# Patient Record
Sex: Female | Born: 1935 | Race: White | Hispanic: No | Smoking: Former smoker
Health system: Southern US, Community
[De-identification: ages and names within clinical notes are randomized; demographics above are authoritative.]

## PROBLEM LIST (undated history)

## (undated) DIAGNOSIS — I723 Aneurysm of iliac artery: Secondary | ICD-10-CM

## (undated) DIAGNOSIS — J449 Chronic obstructive pulmonary disease, unspecified: Secondary | ICD-10-CM

## (undated) DIAGNOSIS — I4729 Other ventricular tachycardia: Secondary | ICD-10-CM

## (undated) DIAGNOSIS — I472 Ventricular tachycardia: Secondary | ICD-10-CM

## (undated) DIAGNOSIS — E785 Hyperlipidemia, unspecified: Secondary | ICD-10-CM

## (undated) DIAGNOSIS — I714 Abdominal aortic aneurysm, without rupture, unspecified: Secondary | ICD-10-CM

## (undated) DIAGNOSIS — N762 Acute vulvitis: Secondary | ICD-10-CM

## (undated) DIAGNOSIS — I4891 Unspecified atrial fibrillation: Secondary | ICD-10-CM

## (undated) DIAGNOSIS — I255 Ischemic cardiomyopathy: Secondary | ICD-10-CM

## (undated) DIAGNOSIS — I701 Atherosclerosis of renal artery: Secondary | ICD-10-CM

## (undated) DIAGNOSIS — A4902 Methicillin resistant Staphylococcus aureus infection, unspecified site: Secondary | ICD-10-CM

## (undated) DIAGNOSIS — I6529 Occlusion and stenosis of unspecified carotid artery: Secondary | ICD-10-CM

## (undated) DIAGNOSIS — I2510 Atherosclerotic heart disease of native coronary artery without angina pectoris: Secondary | ICD-10-CM

## (undated) DIAGNOSIS — Z720 Tobacco use: Secondary | ICD-10-CM

## (undated) DIAGNOSIS — I10 Essential (primary) hypertension: Secondary | ICD-10-CM

## (undated) DIAGNOSIS — G459 Transient cerebral ischemic attack, unspecified: Secondary | ICD-10-CM

## (undated) DIAGNOSIS — I739 Peripheral vascular disease, unspecified: Secondary | ICD-10-CM

## (undated) HISTORY — DX: Peripheral vascular disease, unspecified: I73.9

## (undated) HISTORY — DX: Abdominal aortic aneurysm, without rupture, unspecified: I71.40

## (undated) HISTORY — DX: Other ventricular tachycardia: I47.29

## (undated) HISTORY — DX: Ventricular tachycardia: I47.2

## (undated) HISTORY — DX: Unspecified atrial fibrillation: I48.91

## (undated) HISTORY — DX: Hyperlipidemia, unspecified: E78.5

## (undated) HISTORY — DX: Tobacco use: Z72.0

## (undated) HISTORY — DX: Atherosclerosis of renal artery: I70.1

## (undated) HISTORY — DX: Essential (primary) hypertension: I10

## (undated) HISTORY — DX: Acute vulvitis: N76.2

## (undated) HISTORY — PX: FINGER SURGERY: SHX640

## (undated) HISTORY — DX: Ischemic cardiomyopathy: I25.5

## (undated) HISTORY — DX: Aneurysm of iliac artery: I72.3

## (undated) HISTORY — DX: Methicillin resistant Staphylococcus aureus infection, unspecified site: A49.02

## (undated) HISTORY — PX: CHOLECYSTECTOMY: SHX55

## (undated) HISTORY — DX: Atherosclerotic heart disease of native coronary artery without angina pectoris: I25.10

## (undated) HISTORY — DX: Occlusion and stenosis of unspecified carotid artery: I65.29

## (undated) HISTORY — DX: Abdominal aortic aneurysm, without rupture: I71.4

## (undated) HISTORY — DX: Chronic obstructive pulmonary disease, unspecified: J44.9

## (undated) HISTORY — DX: Transient cerebral ischemic attack, unspecified: G45.9

---

## 1986-08-31 HISTORY — PX: CAROTID ENDARTERECTOMY: SUR193

## 1994-08-31 HISTORY — PX: CORONARY ARTERY BYPASS GRAFT: SHX141

## 1998-04-17 ENCOUNTER — Emergency Department (HOSPITAL_COMMUNITY): Admission: EM | Admit: 1998-04-17 | Discharge: 1998-04-17 | Payer: Self-pay | Admitting: Family Medicine

## 1998-07-20 ENCOUNTER — Encounter: Payer: Self-pay | Admitting: Internal Medicine

## 1998-07-20 ENCOUNTER — Emergency Department (HOSPITAL_COMMUNITY): Admission: EM | Admit: 1998-07-20 | Discharge: 1998-07-20 | Payer: Self-pay | Admitting: Emergency Medicine

## 1998-07-20 ENCOUNTER — Ambulatory Visit (HOSPITAL_COMMUNITY): Admission: RE | Admit: 1998-07-20 | Discharge: 1998-07-20 | Payer: Self-pay | Admitting: Internal Medicine

## 1998-08-27 ENCOUNTER — Ambulatory Visit (HOSPITAL_COMMUNITY): Admission: RE | Admit: 1998-08-27 | Discharge: 1998-08-27 | Payer: Self-pay | Admitting: Family Medicine

## 1998-08-27 ENCOUNTER — Encounter: Payer: Self-pay | Admitting: Family Medicine

## 1998-12-03 ENCOUNTER — Emergency Department (HOSPITAL_COMMUNITY): Admission: EM | Admit: 1998-12-03 | Discharge: 1998-12-03 | Payer: Self-pay | Admitting: Emergency Medicine

## 1998-12-03 ENCOUNTER — Encounter: Payer: Self-pay | Admitting: Emergency Medicine

## 1999-05-21 ENCOUNTER — Ambulatory Visit (HOSPITAL_COMMUNITY): Admission: RE | Admit: 1999-05-21 | Discharge: 1999-05-21 | Payer: Self-pay | Admitting: *Deleted

## 1999-11-07 ENCOUNTER — Inpatient Hospital Stay (HOSPITAL_COMMUNITY): Admission: EM | Admit: 1999-11-07 | Discharge: 1999-11-10 | Payer: Self-pay | Admitting: Emergency Medicine

## 1999-11-07 ENCOUNTER — Encounter: Payer: Self-pay | Admitting: Emergency Medicine

## 1999-11-08 ENCOUNTER — Encounter: Payer: Self-pay | Admitting: Neurology

## 1999-11-25 ENCOUNTER — Ambulatory Visit (HOSPITAL_COMMUNITY): Admission: RE | Admit: 1999-11-25 | Discharge: 1999-11-25 | Payer: Self-pay | Admitting: *Deleted

## 2000-08-16 ENCOUNTER — Inpatient Hospital Stay (HOSPITAL_COMMUNITY): Admission: EM | Admit: 2000-08-16 | Discharge: 2000-08-19 | Payer: Self-pay | Admitting: Emergency Medicine

## 2000-08-16 ENCOUNTER — Encounter: Payer: Self-pay | Admitting: Emergency Medicine

## 2002-06-28 ENCOUNTER — Ambulatory Visit (HOSPITAL_COMMUNITY): Admission: RE | Admit: 2002-06-28 | Discharge: 2002-06-28 | Payer: Self-pay | Admitting: Family Medicine

## 2002-10-10 ENCOUNTER — Encounter: Payer: Self-pay | Admitting: Emergency Medicine

## 2002-10-10 ENCOUNTER — Emergency Department (HOSPITAL_COMMUNITY): Admission: EM | Admit: 2002-10-10 | Discharge: 2002-10-10 | Payer: Self-pay | Admitting: Emergency Medicine

## 2002-10-19 ENCOUNTER — Inpatient Hospital Stay (HOSPITAL_COMMUNITY): Admission: EM | Admit: 2002-10-19 | Discharge: 2002-10-24 | Payer: Self-pay | Admitting: Internal Medicine

## 2002-10-19 ENCOUNTER — Encounter: Payer: Self-pay | Admitting: Cardiology

## 2002-10-19 ENCOUNTER — Encounter: Payer: Self-pay | Admitting: Emergency Medicine

## 2002-10-19 ENCOUNTER — Emergency Department (HOSPITAL_COMMUNITY): Admission: EM | Admit: 2002-10-19 | Discharge: 2002-10-19 | Payer: Self-pay | Admitting: Emergency Medicine

## 2004-01-09 ENCOUNTER — Emergency Department (HOSPITAL_COMMUNITY): Admission: EM | Admit: 2004-01-09 | Discharge: 2004-01-09 | Payer: Self-pay | Admitting: Emergency Medicine

## 2004-02-01 ENCOUNTER — Inpatient Hospital Stay (HOSPITAL_COMMUNITY): Admission: AD | Admit: 2004-02-01 | Discharge: 2004-02-13 | Payer: Self-pay | Admitting: *Deleted

## 2004-02-01 ENCOUNTER — Emergency Department (HOSPITAL_COMMUNITY): Admission: EM | Admit: 2004-02-01 | Discharge: 2004-02-01 | Payer: Self-pay | Admitting: Emergency Medicine

## 2004-04-14 ENCOUNTER — Ambulatory Visit (HOSPITAL_COMMUNITY): Admission: RE | Admit: 2004-04-14 | Discharge: 2004-04-14 | Payer: Self-pay | Admitting: Internal Medicine

## 2004-05-01 ENCOUNTER — Emergency Department (HOSPITAL_COMMUNITY): Admission: EM | Admit: 2004-05-01 | Discharge: 2004-05-01 | Payer: Self-pay | Admitting: Emergency Medicine

## 2004-05-07 ENCOUNTER — Ambulatory Visit: Payer: Self-pay | Admitting: Infectious Diseases

## 2004-05-09 ENCOUNTER — Ambulatory Visit: Payer: Self-pay | Admitting: *Deleted

## 2004-05-29 ENCOUNTER — Ambulatory Visit: Payer: Self-pay | Admitting: *Deleted

## 2004-05-29 ENCOUNTER — Ambulatory Visit: Payer: Self-pay | Admitting: Internal Medicine

## 2004-05-30 ENCOUNTER — Ambulatory Visit: Payer: Self-pay | Admitting: *Deleted

## 2004-07-16 ENCOUNTER — Ambulatory Visit: Payer: Self-pay | Admitting: Internal Medicine

## 2004-07-30 ENCOUNTER — Ambulatory Visit: Payer: Self-pay | Admitting: Internal Medicine

## 2004-08-21 ENCOUNTER — Ambulatory Visit: Payer: Self-pay | Admitting: Internal Medicine

## 2004-09-01 ENCOUNTER — Ambulatory Visit: Payer: Self-pay | Admitting: Internal Medicine

## 2004-09-09 ENCOUNTER — Ambulatory Visit: Payer: Self-pay | Admitting: Internal Medicine

## 2005-04-28 ENCOUNTER — Ambulatory Visit: Payer: Self-pay | Admitting: Cardiology

## 2005-05-06 ENCOUNTER — Ambulatory Visit: Payer: Self-pay

## 2005-05-12 ENCOUNTER — Encounter: Admission: RE | Admit: 2005-05-12 | Discharge: 2005-05-12 | Payer: Self-pay | Admitting: Orthopedic Surgery

## 2005-05-13 ENCOUNTER — Ambulatory Visit (HOSPITAL_BASED_OUTPATIENT_CLINIC_OR_DEPARTMENT_OTHER): Admission: RE | Admit: 2005-05-13 | Discharge: 2005-05-13 | Payer: Self-pay | Admitting: Orthopedic Surgery

## 2005-05-13 ENCOUNTER — Ambulatory Visit (HOSPITAL_COMMUNITY): Admission: RE | Admit: 2005-05-13 | Discharge: 2005-05-13 | Payer: Self-pay | Admitting: Orthopedic Surgery

## 2005-10-30 ENCOUNTER — Ambulatory Visit: Payer: Self-pay

## 2005-11-03 ENCOUNTER — Ambulatory Visit: Payer: Self-pay | Admitting: Cardiology

## 2006-03-26 ENCOUNTER — Ambulatory Visit: Payer: Self-pay | Admitting: Cardiology

## 2006-03-26 ENCOUNTER — Inpatient Hospital Stay (HOSPITAL_COMMUNITY): Admission: EM | Admit: 2006-03-26 | Discharge: 2006-04-01 | Payer: Self-pay | Admitting: Emergency Medicine

## 2006-03-30 ENCOUNTER — Encounter (INDEPENDENT_AMBULATORY_CARE_PROVIDER_SITE_OTHER): Payer: Self-pay | Admitting: Specialist

## 2006-04-05 ENCOUNTER — Encounter: Admission: AD | Admit: 2006-04-05 | Discharge: 2006-04-05 | Payer: Self-pay | Admitting: Dentistry

## 2006-04-05 ENCOUNTER — Ambulatory Visit: Payer: Self-pay | Admitting: Dentistry

## 2006-04-08 ENCOUNTER — Ambulatory Visit (HOSPITAL_COMMUNITY): Admission: RE | Admit: 2006-04-08 | Discharge: 2006-04-08 | Payer: Self-pay | Admitting: Dentistry

## 2006-04-08 ENCOUNTER — Ambulatory Visit: Payer: Self-pay | Admitting: Dentistry

## 2006-05-07 ENCOUNTER — Ambulatory Visit: Payer: Self-pay

## 2006-05-10 ENCOUNTER — Ambulatory Visit: Payer: Self-pay | Admitting: Cardiology

## 2006-05-21 ENCOUNTER — Ambulatory Visit: Payer: Self-pay | Admitting: Dentistry

## 2006-07-01 ENCOUNTER — Ambulatory Visit: Payer: Self-pay | Admitting: Dentistry

## 2006-07-20 ENCOUNTER — Emergency Department (HOSPITAL_COMMUNITY): Admission: EM | Admit: 2006-07-20 | Discharge: 2006-07-20 | Payer: Self-pay | Admitting: Emergency Medicine

## 2006-09-03 ENCOUNTER — Ambulatory Visit: Payer: Self-pay | Admitting: Dentistry

## 2006-10-29 ENCOUNTER — Ambulatory Visit: Payer: Self-pay | Admitting: Cardiology

## 2006-10-29 LAB — CONVERTED CEMR LAB
BUN: 8 mg/dL (ref 6–23)
Basophils Relative: 1.5 % — ABNORMAL HIGH (ref 0.0–1.0)
CO2: 29 meq/L (ref 19–32)
Calcium: 9.8 mg/dL (ref 8.4–10.5)
Creatinine, Ser: 0.9 mg/dL (ref 0.4–1.2)
Hemoglobin: 14.8 g/dL (ref 12.0–15.0)
Monocytes Absolute: 0.8 10*3/uL — ABNORMAL HIGH (ref 0.2–0.7)
Monocytes Relative: 9.3 % (ref 3.0–11.0)
Potassium: 3.9 meq/L (ref 3.5–5.1)
RDW: 12.6 % (ref 11.5–14.6)
TSH: 0.66 microintl units/mL (ref 0.35–5.50)

## 2006-11-15 ENCOUNTER — Ambulatory Visit: Payer: Self-pay | Admitting: Internal Medicine

## 2006-11-15 LAB — CONVERTED CEMR LAB
BUN: 12 mg/dL (ref 6–23)
Calcium: 9.3 mg/dL (ref 8.4–10.5)
Chloride: 103 meq/L (ref 96–112)
Creatinine, Ser: 1.1 mg/dL (ref 0.4–1.2)
Pro B Natriuretic peptide (BNP): 580 pg/mL — ABNORMAL HIGH (ref 0.0–100.0)

## 2006-11-23 ENCOUNTER — Ambulatory Visit: Payer: Self-pay

## 2006-11-23 ENCOUNTER — Encounter: Payer: Self-pay | Admitting: Cardiology

## 2006-12-03 ENCOUNTER — Emergency Department (HOSPITAL_COMMUNITY): Admission: EM | Admit: 2006-12-03 | Discharge: 2006-12-03 | Payer: Self-pay | Admitting: Emergency Medicine

## 2006-12-08 ENCOUNTER — Ambulatory Visit: Payer: Self-pay

## 2006-12-22 ENCOUNTER — Encounter: Admission: RE | Admit: 2006-12-22 | Discharge: 2006-12-22 | Payer: Self-pay | Admitting: Vascular Surgery

## 2006-12-29 ENCOUNTER — Ambulatory Visit: Payer: Self-pay | Admitting: Vascular Surgery

## 2006-12-30 ENCOUNTER — Ambulatory Visit: Payer: Self-pay | Admitting: Cardiology

## 2007-04-12 ENCOUNTER — Ambulatory Visit: Payer: Self-pay | Admitting: Cardiology

## 2007-04-12 LAB — CONVERTED CEMR LAB
AST: 24 units/L (ref 0–37)
Albumin: 3.9 g/dL (ref 3.5–5.2)
Alkaline Phosphatase: 106 units/L (ref 39–117)
BUN: 22 mg/dL (ref 6–23)
Calcium: 9.6 mg/dL (ref 8.4–10.5)
Chloride: 102 meq/L (ref 96–112)
Creatinine, Ser: 1.1 mg/dL (ref 0.4–1.2)
Total Bilirubin: 0.8 mg/dL (ref 0.3–1.2)

## 2007-07-19 ENCOUNTER — Ambulatory Visit: Payer: Self-pay | Admitting: Vascular Surgery

## 2007-09-06 IMAGING — CT CT ANGIO ABDOMEN
2 of 5 series · 16 of 46 positions shown, 18 images · IV contrast ([ID] OMNI 300)
Comparison: 03/26/2006

CLINICAL DATA: Prestent AAA

CT ANGIOGRAPHY OF ABDOMEN - AAA PROTOCOL
TECHNIQUE: Multidetector CT imaging of the abdomen was performed before and
during bolus injection of intravenous contrast.  Multiplanar CT angiographic
image reconstructions were also generated to evaluate the vascular anatomy.
Contrast:  100 cc Omnipaque 300
TECHNIQUE: Multidetector CT imaging of the pelvis was performed before and
image reconstructions were also generated to evaluate the vascular structures. 
(Contrast dose noted above.)

[Series 5: angio · axial · 0.78mm/px · z∈[-366,-43]mm · 13 of 145 slices shown, 15 images]
[im 8/145  soft-tissue]
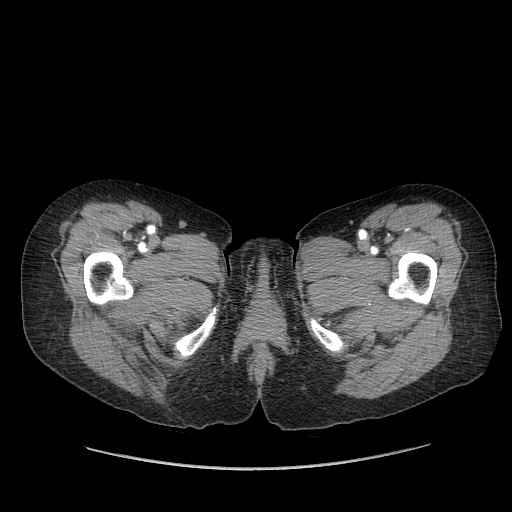
[im 8/145  bone]
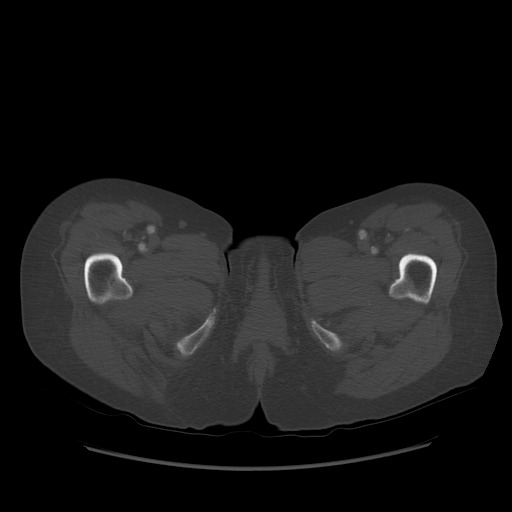
[im 23/145  soft-tissue]
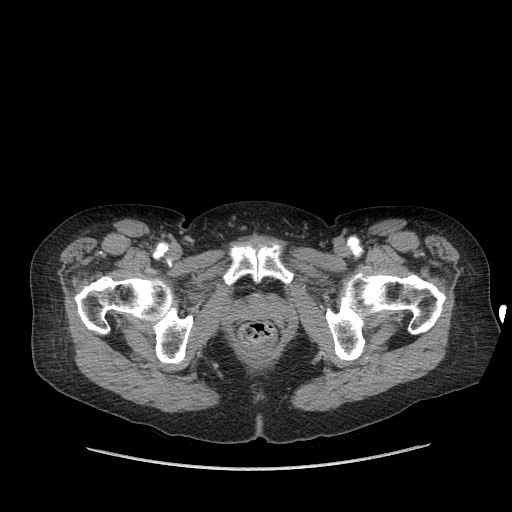
[im 31/145  soft-tissue]
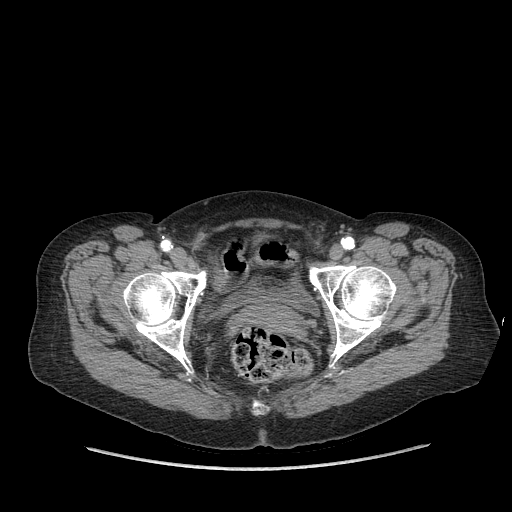
[im 38/145  soft-tissue]
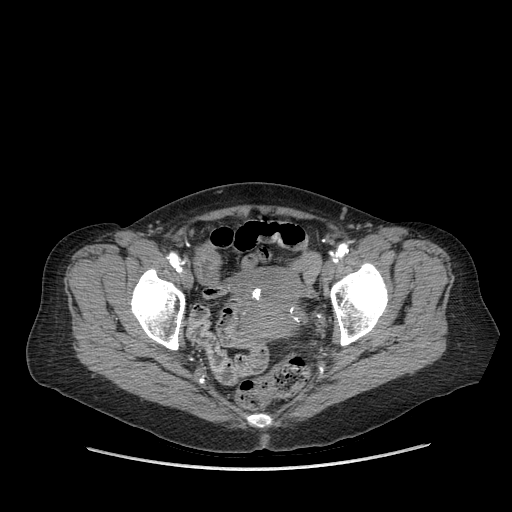
[im 54/145  soft-tissue]
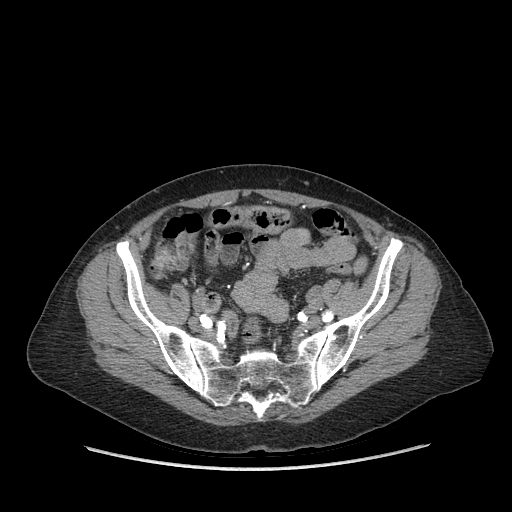
[im 61/145  soft-tissue]
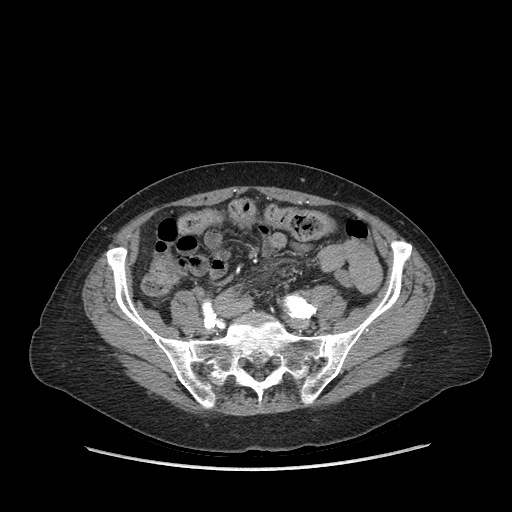
[im 76/145  soft-tissue]
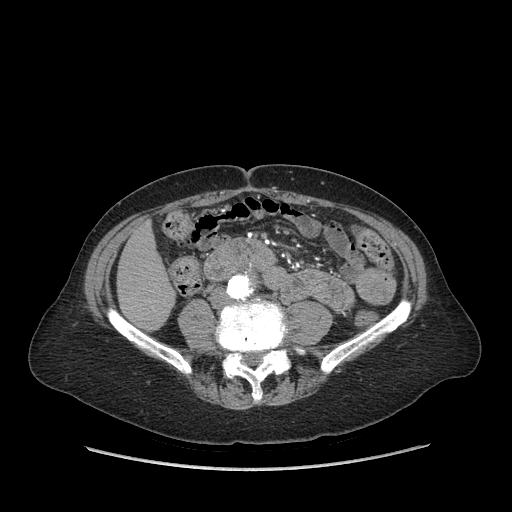
[im 84/145  soft-tissue]
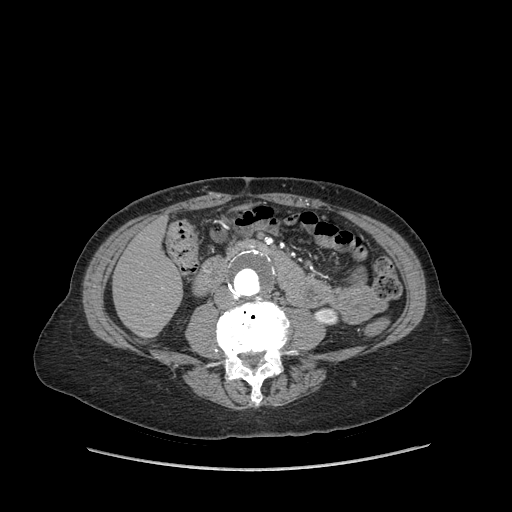
[im 91/145  soft-tissue]
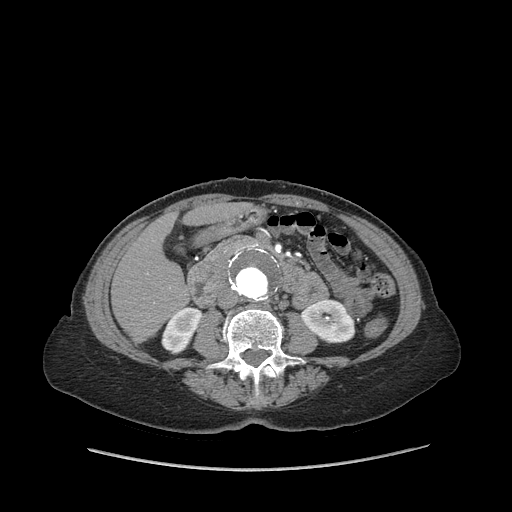
[im 91/145  bone]
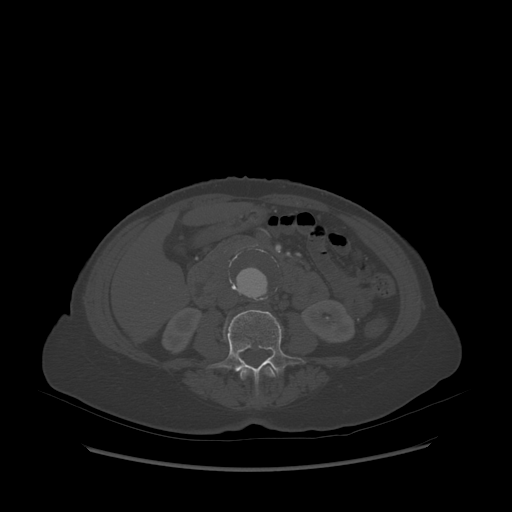
[im 107/145  soft-tissue]
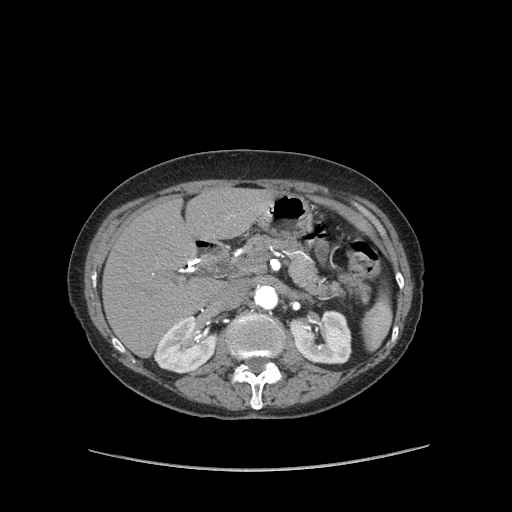
[im 114/145  soft-tissue]
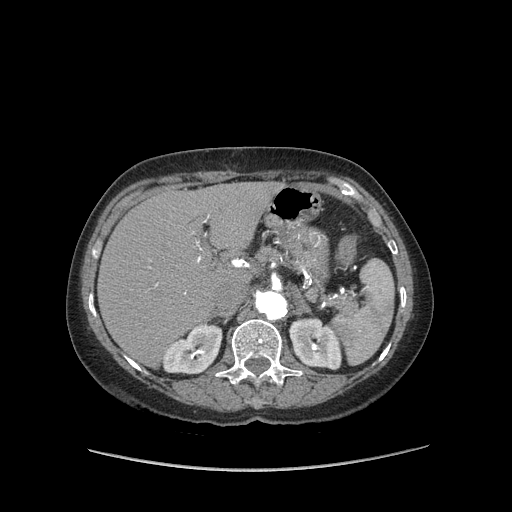
[im 122/145  soft-tissue]
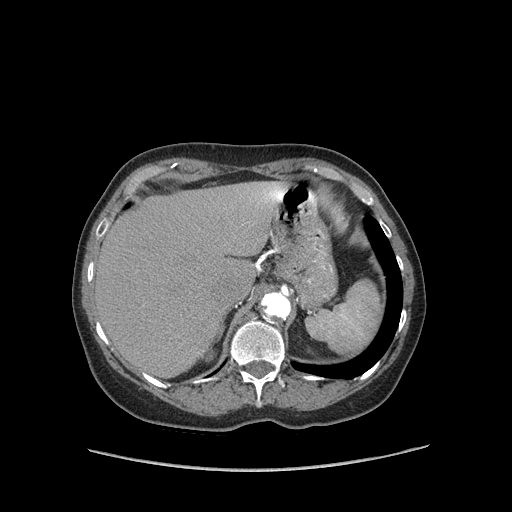
[im 137/145  soft-tissue]
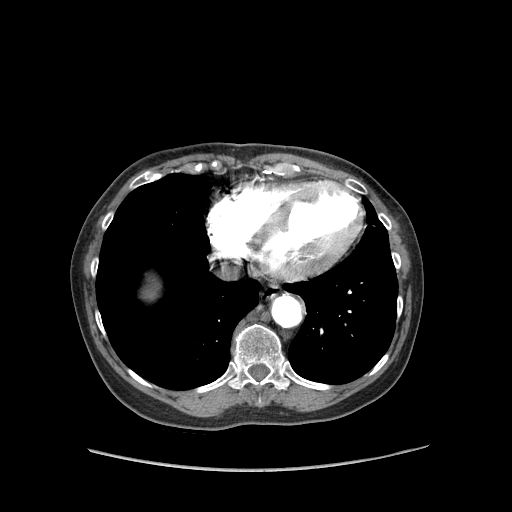

[Series 602: sagittal angio · sagittal · 0.78mm/px · 3 of 153 slices shown]
[im 51/153  soft-tissue]
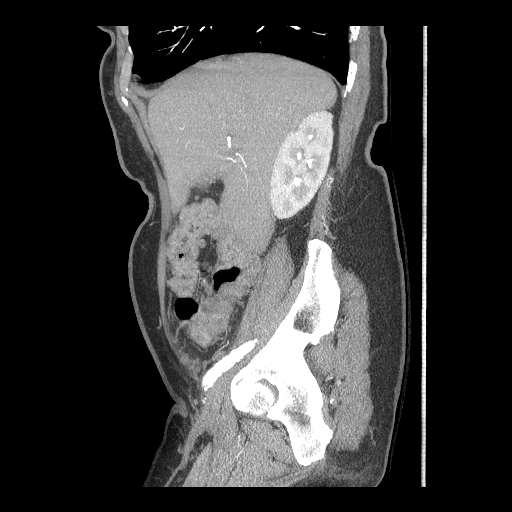
[im 68/153  soft-tissue]
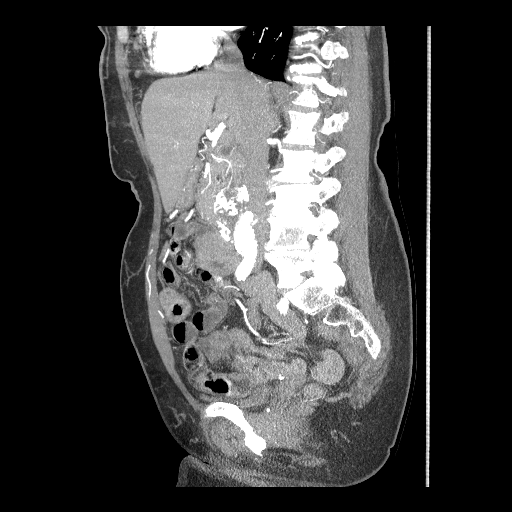
[im 85/153  soft-tissue]
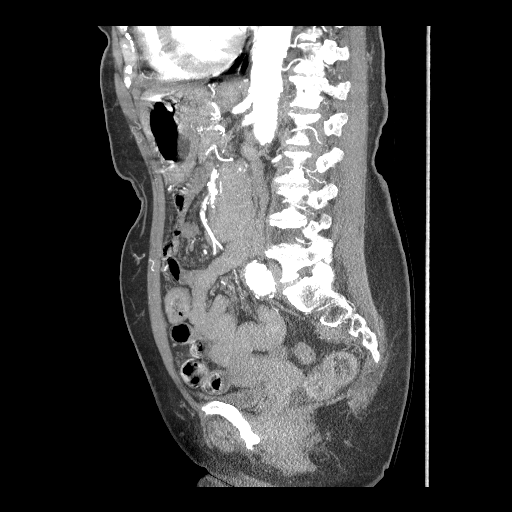

[16 of 46 positions shown; findings below may reference images not displayed]

FINDINGS: The infrarenal abdominal aortic aneurysm is again noted, measuring
similar size is prior study, at 4.2 x 3.9 cm. There is extensive mural plaque
noted within the aneurysm sac. The inferior mesenteric artery appears to be
patent at its origin although very small. There is atherosclerotic irregularity
and mural plaque noted in the distal descending thoracic aorta and at the aortic
hiatus.

Moderate atherosclerotic calcifications are noted at the origin of the
mesenteric vessels and renal arteries without definite significant stenosis
except for the IMA.

Patient is status post cholecystectomy. Liver, spleen, pancreas, adrenals,
kidneys unremarkable. There is a small gas collection in the region of the
pancreatic head, most likely a very small duodenal diverticulum. Scattered
colonic diverticula noted without active diverticulitis. No free fluid, free
air, or adenopathy.

Degenerative changes noted in the lumbar spine. Lung bases are clear.

Length of infrarenal neck (from lowest renal artery):  1.9 cm
Diameter of infrarenal neck:  1.8 cm
Number of renal arteries:  Right = 1; Left = 1

Total length of aneurysm:  7.0 cm
Greatest aneurysm diameter:  4.2 cm
Aneurysm ends at aortic bifurcation: Yes 
If no, Distance from aneurysm to bifurcation:  

IMPRESSION

4.2 cm infrarenal abdominal aortic aneurysm with measurements as described
above. PCNs at the aortic bifurcation.

There is described irregularity and mural plaque within the descending thoracic
aorta and at the aortic hiatus.

IMA appears patent but very small in caliber.

Prior cholecystectomy.

CT ANGIOGRAPHY OF PELVIS - AAA PROTOCOL
FINDINGS: The left common iliac artery is mildly aneurysmal distally. Right
common iliac artery is normal caliber. Moderate Diesen spine disease noted in the
iliac vessels and common femoral arteries bilaterally. No evidence of
significant stenosis.

Bowel is grossly unremarkable. Calcified uterine fibroids noted. No free fluid,
free air, or adenopathy.

Greatest common iliac artery diameters:  Right = 1.2 cm ; Left = 1.9 cm
Diameter of common iliac arteries just above iliac bifurcation:  Right = 1.0 cm
; Left = 1.5 cm
Length of common iliac arteries:  Right = 6.3 cm ;  Left = 6.1 cm

IMPRESSION

Mild aneurysmal dilatation of the mid and distal left common iliac artery as
described above. Moderate atherosclerotic disease seen throughout the iliac and
common femoral arteries. No significant stenosis.

Calcified uterine fibroids.

## 2007-10-14 ENCOUNTER — Emergency Department (HOSPITAL_COMMUNITY): Admission: EM | Admit: 2007-10-14 | Discharge: 2007-10-14 | Payer: Self-pay | Admitting: Emergency Medicine

## 2007-10-20 ENCOUNTER — Ambulatory Visit: Payer: Self-pay | Admitting: Cardiology

## 2007-10-26 ENCOUNTER — Emergency Department (HOSPITAL_COMMUNITY): Admission: EM | Admit: 2007-10-26 | Discharge: 2007-10-26 | Payer: Self-pay | Admitting: Emergency Medicine

## 2007-11-03 ENCOUNTER — Ambulatory Visit: Payer: Self-pay

## 2007-12-15 ENCOUNTER — Ambulatory Visit: Payer: Self-pay

## 2007-12-15 ENCOUNTER — Ambulatory Visit: Payer: Self-pay | Admitting: Cardiology

## 2007-12-15 LAB — CONVERTED CEMR LAB
Albumin: 3.5 g/dL (ref 3.5–5.2)
Alkaline Phosphatase: 78 units/L (ref 39–117)
Bilirubin, Direct: 0.1 mg/dL (ref 0.0–0.3)
LDL Cholesterol: 82 mg/dL (ref 0–99)
Total CHOL/HDL Ratio: 4.1
VLDL: 24 mg/dL (ref 0–40)

## 2008-01-17 ENCOUNTER — Ambulatory Visit: Payer: Self-pay | Admitting: Vascular Surgery

## 2008-01-28 ENCOUNTER — Emergency Department (HOSPITAL_COMMUNITY): Admission: EM | Admit: 2008-01-28 | Discharge: 2008-01-28 | Payer: Self-pay | Admitting: Emergency Medicine

## 2008-02-06 ENCOUNTER — Other Ambulatory Visit: Admission: RE | Admit: 2008-02-06 | Discharge: 2008-02-06 | Payer: Self-pay | Admitting: Obstetrics and Gynecology

## 2008-02-09 ENCOUNTER — Ambulatory Visit: Payer: Self-pay | Admitting: Cardiology

## 2008-06-29 ENCOUNTER — Ambulatory Visit: Payer: Self-pay | Admitting: Cardiology

## 2008-08-13 ENCOUNTER — Ambulatory Visit: Payer: Self-pay | Admitting: Cardiology

## 2008-08-13 LAB — CONVERTED CEMR LAB
CO2: 31 meq/L (ref 19–32)
Chloride: 102 meq/L (ref 96–112)
Creatinine, Ser: 1.2 mg/dL (ref 0.4–1.2)
Potassium: 4 meq/L (ref 3.5–5.1)

## 2008-08-23 ENCOUNTER — Encounter: Payer: Self-pay | Admitting: Cardiology

## 2008-08-23 ENCOUNTER — Ambulatory Visit: Payer: Self-pay

## 2008-08-27 ENCOUNTER — Ambulatory Visit: Payer: Self-pay | Admitting: Cardiology

## 2008-08-27 LAB — CONVERTED CEMR LAB
CO2: 33 meq/L — ABNORMAL HIGH (ref 19–32)
Calcium: 9.4 mg/dL (ref 8.4–10.5)
Chloride: 102 meq/L (ref 96–112)
Glucose, Bld: 105 mg/dL — ABNORMAL HIGH (ref 70–99)
Sodium: 142 meq/L (ref 135–145)

## 2008-08-30 ENCOUNTER — Ambulatory Visit: Payer: Self-pay

## 2008-09-04 ENCOUNTER — Ambulatory Visit: Payer: Self-pay

## 2008-09-18 ENCOUNTER — Ambulatory Visit: Payer: Self-pay | Admitting: Cardiology

## 2008-11-06 ENCOUNTER — Ambulatory Visit: Payer: Self-pay

## 2008-12-31 ENCOUNTER — Ambulatory Visit: Payer: Self-pay

## 2008-12-31 ENCOUNTER — Encounter: Payer: Self-pay | Admitting: Cardiology

## 2009-01-04 ENCOUNTER — Encounter (INDEPENDENT_AMBULATORY_CARE_PROVIDER_SITE_OTHER): Payer: Self-pay | Admitting: *Deleted

## 2009-01-04 DIAGNOSIS — I714 Abdominal aortic aneurysm, without rupture, unspecified: Secondary | ICD-10-CM | POA: Insufficient documentation

## 2009-01-08 ENCOUNTER — Ambulatory Visit: Payer: Self-pay | Admitting: Vascular Surgery

## 2009-03-06 ENCOUNTER — Encounter: Payer: Self-pay | Admitting: Cardiology

## 2009-03-20 ENCOUNTER — Encounter: Payer: Self-pay | Admitting: Cardiology

## 2009-03-29 ENCOUNTER — Encounter: Payer: Self-pay | Admitting: Cardiology

## 2009-04-10 ENCOUNTER — Ambulatory Visit: Payer: Self-pay

## 2009-04-10 ENCOUNTER — Encounter: Payer: Self-pay | Admitting: Cardiology

## 2009-05-01 DIAGNOSIS — G459 Transient cerebral ischemic attack, unspecified: Secondary | ICD-10-CM | POA: Insufficient documentation

## 2009-05-01 DIAGNOSIS — I5022 Chronic systolic (congestive) heart failure: Secondary | ICD-10-CM

## 2009-05-01 DIAGNOSIS — I723 Aneurysm of iliac artery: Secondary | ICD-10-CM | POA: Insufficient documentation

## 2009-05-01 DIAGNOSIS — I2589 Other forms of chronic ischemic heart disease: Secondary | ICD-10-CM | POA: Insufficient documentation

## 2009-05-01 DIAGNOSIS — I701 Atherosclerosis of renal artery: Secondary | ICD-10-CM

## 2009-05-01 DIAGNOSIS — A4902 Methicillin resistant Staphylococcus aureus infection, unspecified site: Secondary | ICD-10-CM | POA: Insufficient documentation

## 2009-05-01 DIAGNOSIS — F17200 Nicotine dependence, unspecified, uncomplicated: Secondary | ICD-10-CM

## 2009-05-01 DIAGNOSIS — I739 Peripheral vascular disease, unspecified: Secondary | ICD-10-CM | POA: Insufficient documentation

## 2009-05-01 DIAGNOSIS — I472 Ventricular tachycardia, unspecified: Secondary | ICD-10-CM | POA: Insufficient documentation

## 2009-05-01 DIAGNOSIS — E785 Hyperlipidemia, unspecified: Secondary | ICD-10-CM

## 2009-05-01 DIAGNOSIS — I10 Essential (primary) hypertension: Secondary | ICD-10-CM | POA: Insufficient documentation

## 2009-05-02 ENCOUNTER — Telehealth: Payer: Self-pay | Admitting: Cardiology

## 2009-05-02 ENCOUNTER — Ambulatory Visit: Payer: Self-pay | Admitting: Cardiology

## 2009-05-02 DIAGNOSIS — I2510 Atherosclerotic heart disease of native coronary artery without angina pectoris: Secondary | ICD-10-CM | POA: Insufficient documentation

## 2009-05-09 ENCOUNTER — Encounter: Payer: Self-pay | Admitting: Cardiology

## 2009-10-07 ENCOUNTER — Telehealth: Payer: Self-pay | Admitting: Cardiology

## 2009-10-18 ENCOUNTER — Encounter: Payer: Self-pay | Admitting: Cardiology

## 2009-11-28 ENCOUNTER — Encounter: Payer: Self-pay | Admitting: Cardiovascular Disease

## 2009-11-28 DIAGNOSIS — I6529 Occlusion and stenosis of unspecified carotid artery: Secondary | ICD-10-CM

## 2009-11-29 ENCOUNTER — Ambulatory Visit: Payer: Self-pay

## 2009-11-29 ENCOUNTER — Encounter: Payer: Self-pay | Admitting: Cardiovascular Disease

## 2009-12-11 ENCOUNTER — Emergency Department (HOSPITAL_COMMUNITY): Admission: EM | Admit: 2009-12-11 | Discharge: 2009-12-11 | Payer: Self-pay | Admitting: Emergency Medicine

## 2010-01-16 ENCOUNTER — Telehealth (INDEPENDENT_AMBULATORY_CARE_PROVIDER_SITE_OTHER): Payer: Self-pay | Admitting: *Deleted

## 2010-01-16 ENCOUNTER — Ambulatory Visit: Payer: Self-pay | Admitting: Vascular Surgery

## 2010-05-23 ENCOUNTER — Telehealth (INDEPENDENT_AMBULATORY_CARE_PROVIDER_SITE_OTHER): Payer: Self-pay | Admitting: *Deleted

## 2010-06-03 ENCOUNTER — Encounter: Payer: Self-pay | Admitting: Cardiology

## 2010-06-04 ENCOUNTER — Encounter: Payer: Self-pay | Admitting: Cardiovascular Disease

## 2010-06-05 ENCOUNTER — Ambulatory Visit: Payer: Self-pay | Admitting: Cardiology

## 2010-06-05 ENCOUNTER — Ambulatory Visit: Payer: Self-pay

## 2010-06-05 ENCOUNTER — Encounter: Payer: Self-pay | Admitting: Cardiovascular Disease

## 2010-06-09 ENCOUNTER — Encounter: Payer: Self-pay | Admitting: Cardiology

## 2010-08-21 ENCOUNTER — Telehealth: Payer: Self-pay | Admitting: Cardiology

## 2010-09-14 ENCOUNTER — Emergency Department (HOSPITAL_COMMUNITY)
Admission: EM | Admit: 2010-09-14 | Discharge: 2010-09-14 | Payer: Self-pay | Source: Home / Self Care | Admitting: Emergency Medicine

## 2010-09-23 ENCOUNTER — Telehealth: Payer: Self-pay | Admitting: Cardiology

## 2010-09-24 ENCOUNTER — Ambulatory Visit: Admission: RE | Admit: 2010-09-24 | Discharge: 2010-09-24 | Payer: Self-pay | Source: Home / Self Care

## 2010-09-24 ENCOUNTER — Other Ambulatory Visit: Payer: Self-pay | Admitting: Cardiovascular Disease

## 2010-09-24 ENCOUNTER — Ambulatory Visit
Admission: RE | Admit: 2010-09-24 | Discharge: 2010-09-24 | Payer: Self-pay | Source: Home / Self Care | Attending: Cardiovascular Disease | Admitting: Cardiovascular Disease

## 2010-09-24 DIAGNOSIS — I359 Nonrheumatic aortic valve disorder, unspecified: Secondary | ICD-10-CM | POA: Insufficient documentation

## 2010-09-24 LAB — CBC WITH DIFFERENTIAL/PLATELET
Basophils Absolute: 0.1 10*3/uL (ref 0.0–0.1)
HCT: 39.2 % (ref 36.0–46.0)
Lymphocytes Relative: 17.9 % (ref 12.0–46.0)
Lymphs Abs: 1.9 10*3/uL (ref 0.7–4.0)
Monocytes Relative: 9.5 % (ref 3.0–12.0)
Neutrophils Relative %: 66.2 % (ref 43.0–77.0)
Platelets: 382 10*3/uL (ref 150.0–400.0)
RDW: 13.5 % (ref 11.5–14.6)

## 2010-09-24 LAB — BASIC METABOLIC PANEL
BUN: 21 mg/dL (ref 6–23)
Calcium: 9.5 mg/dL (ref 8.4–10.5)
Creatinine, Ser: 1.1 mg/dL (ref 0.4–1.2)
GFR: 52.02 mL/min — ABNORMAL LOW (ref >60.00)
Glucose, Bld: 104 mg/dL — ABNORMAL HIGH (ref 70–99)

## 2010-09-24 LAB — BRAIN NATRIURETIC PEPTIDE: Pro B Natriuretic peptide (BNP): 182.3 pg/mL — ABNORMAL HIGH (ref 0.0–100.0)

## 2010-09-28 LAB — CONVERTED CEMR LAB
Albumin: 3.5 g/dL (ref 3.5–5.2)
Albumin: 3.8 g/dL (ref 3.5–5.2)
Alkaline Phosphatase: 82 units/L (ref 39–117)
Alkaline Phosphatase: 95 units/L (ref 39–117)
CO2: 32 meq/L (ref 19–32)
Chloride: 99 meq/L (ref 96–112)
Cholesterol: 145 mg/dL (ref 0–200)
Cholesterol: 150 mg/dL (ref 0–200)
HDL: 38.5 mg/dL — ABNORMAL LOW (ref >39.00)
HDL: 47 mg/dL (ref >39.00)
LDL Cholesterol: 81 mg/dL (ref 0–99)
LDL Cholesterol: 88 mg/dL (ref 0–99)
Potassium: 4.6 meq/L (ref 3.5–5.1)
Sodium: 139 meq/L (ref 135–145)
Total CHOL/HDL Ratio: 3
Total CHOL/HDL Ratio: 4
Total Protein: 6.7 g/dL (ref 6.0–8.3)
Total Protein: 7.1 g/dL (ref 6.0–8.3)
Triglycerides: 120 mg/dL (ref 0.0–149.0)
Triglycerides: 86 mg/dL (ref 0.0–149.0)
VLDL: 17.2 mg/dL (ref 0.0–40.0)
VLDL: 24 mg/dL (ref 0.0–40.0)

## 2010-10-02 NOTE — Assessment & Plan Note (Signed)
Summary: yearly/sl      Allergies Added:   Visit Type:  Follow-up Primary Provider:  Merri Brunette, MD  CC:  CAD.  History of Present Illness: The present for one year followup. Since I last saw her she has done well from a cardiovascular standpoint. We have been following an abdominal aneurysm and left common iliac aneurysm. She is getting six-month followup and had a study today. She has carotid stenosis which is mild to moderate and was last looked at this spring. She is due to have this followed in 2 years. She's had no new cardiovascular complaints. In particular she denies any new shortness of breath, PND or orthopnea. She has no new chest discomfort, neck or arm discomfort. She has no palpitations, presyncope or syncope. Unfortunately she continues to smoke cigarettes one pack per day though this is down from 2 packs per day. 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  Current Medications (verified): 1)  Bisoprolol Fumarate 10 Mg Tabs (Bisoprolol Fumarate) .Marland Kitchen.. 1 By Mouth Daily 2)  Crestor 40 Mg Tabs (Rosuvastatin Calcium) .... One By Mouth Daily 3)  Aspirin 81 Mg  Tabs (Aspirin) .... Daily 4)  Isosorbide Mononitrate Cr 30 Mg Xr24h-Tab (Isosorbide Mononitrate) .... Daily 5)  Ferretts 325 (106 Fe) Mg Tabs (Ferrous Fumarate) .... Three Times A Day 6)  Clonidine Hcl 0.1 Mg Tabs (Clonidine Hcl) .... Two Times A Day 7)  Enalapril Maleate 10 Mg Tabs (Enalapril Maleate) .... Two Times A Day 8)  Zetia 10 Mg Tabs (Ezetimibe) .... Daily 9)  Furosemide 80 Mg Tabs (Furosemide) .... Two Times A Day 10)  Proventil Hfa 108 (90 Base) Mcg/act Aers (Albuterol Sulfate) .... As Needed 11)  Theragran-M  Tabs (Multiple Vitamins-Minerals) .... Two Times A Day 12)  Zyrtec Allergy 10 Mg Caps (Cetirizine Hcl) .... Daily 13)  Lexapro 10 Mg Tabs (Escitalopram Oxalate) .... Daily 14)  Zolpidem Tartrate 10 Mg Tabs (Zolpidem Tartrate) ....  Daily 15)  Nitrostat 0.4 Mg Subl (Nitroglycerin) .... As Needed  Allergies (verified): 1)  ! Codeine 2)  ! Sulfa  Past History:  Past Medical History: 1. Coronary artery disease (status post CABG in 1996 in [place] [facility].     Last stress test 1/10). 2. Ischemic cardiomyopathy (EF approximately 40% echo 2009). 3. Non-sustained ventricular tachycardia. 4. Renal artery stenosis. 5. Peripheral vascular disease (carotid ultrasound 0-39% right stenosis and 40-     59% left stenosis). 6. Abdominal aortic aneurysm (4.5 cm). 7. Left common iliac aneurysm (2.3 cm). 8. MRSA. 9.Vulvar cellulitis. 10.TIAs. 11.Hyperlipidemia. 12.Hypertension. 13.Ongoing tobacco use.   Past Surgical History: 1). A1 pulley release of left index and left ring. 2). Laparoscopic cholecystectomy with intraoperative cholangiogram. 3). Right carotid endarterectomy 1988 4.) CABG  Review of Systems       As stated in the HPI and negative for all other systems.   Vital Signs:  Patient profile:   77 year old female Height:      58 inches Weight:      110 pounds BMI:     23.07 Pulse rate:   66 / minute Resp:     18 per minute BP sitting:   129 / 71  (right arm)  Vitals Entered By: Marrion Coy, CNA (June 05, 2010 9:32 AM)  Physical Exam  General:  Well developed, well nourished, in no acute distress. Head:  normocephalic and atraumatic Eyes:  PERRLA/EOM intact; conjunctiva and lids normal.  Neck:  Neck supple, no JVD. No masses, thyromegaly or abnormal cervical nodes well-healed right carotid endarterectomy scar. Chest Wall:  well-healed sternotomy scar Lungs:  decreased BS bilateral, no wheezing or crackles   Detailed Cardiovascular Exam  Neck    Carotids: Bilateral carotid bruits    Neck Veins: Normal, no JVD.    Heart    Inspection: no deformities or lifts noted.      Palpation: normal PMI with no thrills palpable.      Auscultation: S1 and S2 within normal limits, no S3, no S4, 2 out 6  apical systolic murmur radiating slightly at the aortic outflow tract, no diastolic murmurs  Vascular    Abdominal Aorta: no palpable masses, pulsations, or audible bruits.      Femoral Pulses: normal femoral pulses bilaterally.      Pedal Pulses: normal pedal pulses bilaterally.      Radial Pulses: normal radial pulses bilaterally.      Peripheral Circulation: no clubbing, cyanosis, or edema noted with normal capillary refill.     EKG  Procedure date:  06/05/2010  Findings:      Sinus rhythm, rate 66, left axis deviation, left anterior fascicular block, old anteroseptal infarct, no acute ST-T wave changes  Impression & Recommendations:  Problem # 1:  CORONARY ARTERY DISEASE (ICD-414.00) She has had no new symptoms since her last stress perfusion study. If further imaging is indicated. She needs continued risk reduction as described below. Orders: EKG w/ Interpretation (93000)  Problem # 2:  CAROTID ARTERY DISEASE (ICD-433.10) I have reviewed recent vascular studies. She will have followup as indicated in the history of present illness. Her updated medication list for this problem includes:    Aspirin 81 Mg Tabs (Aspirin) .Marland Kitchen... Daily  Problem # 3:  TOBACCO ABUSE (ICD-305.1) We again discussed the need to stop smoking (greater than 3 minutes).  Problem # 4:  HYPERLIPIDEMIA (ICD-272.4) I reviewed the lipids from her previous labs in September of last year and 88 LDL and 38 HDL. This is on maximal therapy. We will repeat a lipid profile today. Orders: TLB-Lipid Panel (80061-LIPID) TLB-Hepatic/Liver Function Pnl (80076-HEPATIC)  Other Orders: TLB-BMP (Basic Metabolic Panel-BMET) (80048-METABOL)  Patient Instructions: 1)  Your physician recommends that you schedule a follow-up appointment in: 1 year with Dr Antoine Poche 2)  Your physician recommends that you return for lab work  TODAY, BMP,  liver  and lipid profile 3)  Your physician recommends that you continue on your current  medications as directed. Please refer to the Current Medication list given to you today.

## 2010-10-02 NOTE — Miscellaneous (Signed)
Summary: Orders Update  Clinical Lists Changes  Orders: Added new Test order of Abdominal Aorta Duplex (Abd Aorta Duplex) - Signed 

## 2010-10-02 NOTE — Progress Notes (Signed)
Summary: pt need this asap   Phone Note Refill Request Call back at Home Phone [phone] Message from:  Patient on [facility] on [address]  Refills Requested: Medication #1:  PROVENTIL HFA 108 (90 BASE) MCG/ACT AERS as needed pt needs it today & pls call her when you call it in  Initial call taken by: Omer Jack,  August 21, 2010 4:40 PM  Follow-up for Phone Call        No answer. Follow-up by: Marrion Coy, CNA,  August 22, 2010 9:09 AM  Additional Follow-up for Phone Call Additional follow up Details #1::        Pt notified to get RX from pcp. She states understanding. Marrion Coy, CNA  August 22, 2010 11:44 AM  Additional Follow-up by: Marrion Coy, CNA,  August 22, 2010 11:44 AM

## 2010-10-02 NOTE — Letter (Signed)
Summary: Custom - Lipid  [facility], Main Office  [address]   [place], [place] [address]   Phone: [phone]  Fax: [phone]         June 09, 2010 MRN: [id number]     [facility] [address] [place], [place]  [address]   Dear Ms. Vanderpol,  We have reviewed your cholesterol results.  They are as follows:     Total Cholesterol:    145 (Desirable: less than 200)       HDL  Cholesterol:     47.00  (Desirable: greater than 40 for men and 50 for women)       LDL Cholesterol:       81  (Desirable: less than 100 for low risk and less than 70 for moderate to high risk)       Triglycerides:       86.0  (Desirable: less than 150)  Our recommendations include:  No changes, continue same treatment   Call our office at the number listed above if you have any questions.  Lowering your LDL cholesterol is important, but it is only one of a large number of "risk factors" that may indicate that you are at risk for heart disease, stroke or other complications of hardening of the arteries.  Other risk factors include:   A.  Cigarette Smoking* B.  High Blood Pressure* C.  Obesity* D.   Low HDL Cholesterol (see yours above)* E.   Diabetes Mellitus (higher risk if your is uncontrolled) F.  Family history of premature heart disease G.  Previous history of stroke or cardiovascular disease        *These are risk factors YOU HAVE CONTROL OVER.  For more information, visit .  There is now evidence that lowering the TOTAL CHOLESTEROL AND LDL CHOLESTEROL can reduce the risk of heart disease.  The American Heart Association recommends the following guidelines for the treatment of elevated cholesterol:  1.  If there is now current heart disease and less than two risk factors, TOTAL CHOLESTEROL should be less than 200 and LDL CHOLESTEROL should be less than 100. 2.  If there is current heart disease or two or more risk factors, TOTAL CHOLESTEROL should be less  than 200 and LDL CHOLESTEROL should be less than 70.  A diet low in cholesterol, saturated fat, and calories is the cornerstone of treatment for elevated cholesterol.  Cessation of smoking and exercise are also important in the management of elevated cholesterol and preventing vascular disease.  Studies have shown that 30 to 60 minutes of physical activity most days can help lower blood pressure, lower cholesterol, and keep your weight at a healthy level.  Drug therapy is used when cholesterol levels do not respond to therapeutic lifestyle changes (smoking cessation, diet, and exercise) and remains unacceptably high.  If medication is started, it is important to have you levels checked periodically to evaluate the need for further treatment options.    Thank you,     Sander Nephew, RN for  Dr. Rollene Rotunda [facility] Team

## 2010-10-02 NOTE — Progress Notes (Signed)
Summary: sob / no chest pain   Phone Note Call from Patient Call back at Home Phone [phone]   Caller: Patient Reason for Call: Talk to Nurse Summary of Call: pt is having sob no chest. pt would like to talk to a nurse. Initial call taken by: Roe Coombs,  September 23, 2010 12:32 PM  Follow-up for Phone Call        last 3 days - alot of difficulty breathing, has limited her activities - SOB with all activity. taking furosemide 80mg  twice a day.  has not weighed so she doesn't know if it up. No swelling at feet and/or ankles, some cough but she doesn't pay attention to that because she reports being a smoker she always coughs.  Her cough has not changed per her report.  Will review with MD and call pt back Follow-up by: Charolotte Capuchin, RN,  September 23, 2010 12:58 PM  Additional Follow-up for Phone Call Additional follow up Details #1::        Appt given for pt tomorrow at 2pm with Dr Eden Emms, pt is instructed to go to ED for eval if SOB increases at all. Additional Follow-up by: Charolotte Capuchin, RN,  September 23, 2010 2:39 PM

## 2010-10-02 NOTE — Miscellaneous (Signed)
Summary: Orders Update  Clinical Lists Changes  Problems: Added new problem of CAROTID ARTERY DISEASE (ICD-433.10) Orders: Added new Test order of Carotid Duplex (Carotid Duplex) - Signed 

## 2010-10-02 NOTE — Progress Notes (Signed)
Summary: additonal testing lm to cb  pfh,rn   Phone Note Call from Patient Call back at Home Phone [phone]   Caller: Patient Reason for Call: Talk to Nurse Details for Reason: Per pt calling, pt has appt for aorta doppler on 2/18. wants additional testing left ilac.  Initial call taken by: Lorne Skeens,  October 07, 2009 2:21 PM  Follow-up for Phone Call        Left message for pt to call back about futher testing she is requesting  Sander Nephew, RN  pt really needs someone to call her back regarding this can you please call today Omer Jack  October 10, 2009 2:28 PM   Additional Follow-up for Phone Call Additional follow up Details #1::        PT RETURNING CALL ABOUT MORE TEST.SIGN Additional Follow-up by: Judie Grieve,  October 08, 2009 9:30 AM    Additional Follow-up for Phone Call Additional follow up Details #2::    pt calling again, Migdalia Dk  October 09, 2009 2:39 PM   Pt wants to know if her Iliac aneurysm can be evaluated at the same time as her AAA. I called and s/w Missy in vascular to ask her that question. Missy said they are able to look at both of them with the same exam, if we place a comment on the order. Sharonda placed the comment in [facility] for the exam. Pt aware that both will be evaluated on the 18th. I will pass this message on to [facility] so they will be aware.  Follow-up by: Duncan Dull, RN, BSN,  October 10, 2009 3:12 PM

## 2010-10-02 NOTE — Progress Notes (Signed)
   records faxed to [facility] @ [phone] [facility]  May 23, 2010 9:05 AM

## 2010-10-02 NOTE — Miscellaneous (Signed)
  Clinical Lists Changes  Observations: Added new observation of US CAROTID: Stable carotid artery disease, with serpentine and redundant ICA's 0-39% bilateral ICA stenosis  f/u 2 years (11/29/2009 11:12) Added new observation of ABDOM US: Slight increase in dimensions of juxtarenal fusiform AAA, now 4.5cm X 4.7 cm Moderate thrombus burden within the AAA NOrmal caliber  right common and bilateral external iliac arteries Slight increase in dimensions of left common iliac artery aneurysm, at 2.4cm X 2.5 cm  f/u 6 months (11/29/2009 11:11)      [place] of Abdomen  Procedure date:  11/29/2009  Findings:      Slight increase in dimensions of juxtarenal fusiform AAA, now 4.5cm X 4.7 cm Moderate thrombus burden within the AAA NOrmal caliber  right common and bilateral external iliac arteries Slight increase in dimensions of left common iliac artery aneurysm, at 2.4cm X 2.5 cm  f/u 6 months  Carotid Doppler  Procedure date:  11/29/2009  Findings:      Stable carotid artery disease, with serpentine and redundant ICA's 0-39% bilateral ICA stenosis  f/u 2 years

## 2010-10-02 NOTE — Progress Notes (Signed)
   Faxed Ultrasound to Kay/Dr.Dickson office [phone] [facility]  Jan 16, 2010 9:21 AM

## 2010-10-08 NOTE — Assessment & Plan Note (Signed)
Summary: sob per [facility]   pfh,rn  Medications Added AVELOX 400 MG TABS (MOXIFLOXACIN HCL) 1 once daily      Allergies Added:   Primary Provider:  Merri Brunette, MD  CC:  sob.  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  Increasing SOB last week.  Lots of people "sick' around her.  Weight stable does indicate inability to lay flat.  Cough productive of clear sputum.  Still smoking.  Clincially has significant COPD.  Did not call her primary at [facility].  Has been compliant with meds.  No fever, edema, sscp, palpitatoins or rash.  Allergic to sulfa.  Discussed with patient that I though this was a COPD exacerbation and Bronchitis.  She will be given Avelox for 7 days and told to call Dr Katrinka Blazing at [place] to F/U.  Will check CXR and labs including BNP although clinically this does not appear to be CHF.  May need a prednisone taper.    Also encouraged her to see if her primary can refer her to our pulmonary doctors  Reviewed CXR hyperaeration with no CHF Reviewed labs  BNP only 182   Current Problems (verified): 1)  Aortic Valve Disorders  (ICD-424.1) 2)  Cough  (ICD-786.2) 3)  Antihyperlipidemic Use, Long Term  (ICD-V58.69) 4)  Carotid Artery Disease  (ICD-433.10) 5)  Coronary Artery Disease  (ICD-414.00) 6)  Tobacco Abuse  (ICD-305.1) 7)  Transient Ischemic Attack  (ICD-435.9) 8)  Mrsa  (ICD-041.12) 9)  Iliac Artery Aneurysm  (ICD-442.2) 10)  Peripheral Vascular Disease  (ICD-443.9) 11)  Renal Artery Stenosis  (ICD-440.1) 12)  Ventricular Tachycardia  (ICD-427.1) 13)  Cardiomyopathy, Ischemic  (ICD-414.8) 14)  CHF  (ICD-428.0) 15)  Hypertension  (ICD-401.9) 16)  Hyperlipidemia  (ICD-272.4) 17)  Aaa  (ICD-441.4)  Current Medications (verified): 1)  Bisoprolol Fumarate 10 Mg Tabs (Bisoprolol Fumarate) .Marland Kitchen.. 1 By Mouth Daily 2)  Crestor 40 Mg Tabs (Rosuvastatin Calcium) .... One By Mouth Daily 3)  Aspirin 81 Mg  Tabs  (Aspirin) .... Daily 4)  Isosorbide Mononitrate Cr 30 Mg Xr24h-Tab (Isosorbide Mononitrate) .... Daily 5)  Ferretts 325 (106 Fe) Mg Tabs (Ferrous Fumarate) .... Three Times A Day 6)  Clonidine Hcl 0.1 Mg Tabs (Clonidine Hcl) .... Two Times A Day 7)  Enalapril Maleate 10 Mg Tabs (Enalapril Maleate) .... Two Times A Day 8)  Zetia 10 Mg Tabs (Ezetimibe) .... Daily 9)  Furosemide 80 Mg Tabs (Furosemide) .... Two Times A Day 10)  Proventil Hfa 108 (90 Base) Mcg/act Aers (Albuterol Sulfate) .... As Needed 11)  Theragran-M  Tabs (Multiple Vitamins-Minerals) .... Two Times A Day 12)  Zyrtec Allergy 10 Mg Caps (Cetirizine Hcl) .... Daily 13)  Nitrostat 0.4 Mg Subl (Nitroglycerin) .... As Needed 14)  Avelox 400 Mg Tabs (Moxifloxacin Hcl) .Marland Kitchen.. 1 Once Daily  Allergies (verified): 1)  ! Codeine 2)  ! Sulfa  Past History:  Past Medical History: Last updated: 06/05/2010 1. Coronary artery disease (status post CABG in 1996 in [place] [facility].     Last stress test 1/10). 2. Ischemic cardiomyopathy (EF approximately 40% echo 2009). 3. Non-sustained ventricular tachycardia. 4. Renal artery stenosis. 5. Peripheral vascular disease (carotid ultrasound 0-39% right stenosis and 40-     59% left stenosis). 6. Abdominal aortic aneurysm (4.5 cm). 7. Left common iliac aneurysm (2.3 cm). 8. MRSA. 9.Vulvar cellulitis. 10.TIAs. 11.Hyperlipidemia. 12.Hypertension. 13.Ongoing tobacco use.   Past Surgical History: Last updated: 06/05/2010 1). A1  pulley release of left index and left ring. 2). Laparoscopic cholecystectomy with intraoperative cholangiogram. 3). Right carotid endarterectomy 1988 4.) CABG  Family History: Last updated: 05/01/2009  Significant for hypertension and heart problems.  Social History: Last updated: 05/01/2009  The patient smokes around one pack since 1970.  She drinks  alcohol occasionally and she denies any recreational drugs.  Review of Systems       Denies fever, malais,  weight loss, blurry vision, decreased visual acuity, cough, sputum,  hemoptysis, pleuritic pain, palpitaitons, heartburn, abdominal pain, melena, lower extremity edema, claudication, or rash.   Vital Signs:  Patient profile:   77 year old female Height:      58 inches Weight:      105 pounds BMI:     22.02 Pulse rate:   96 / minute Resp:     16 per minute BP sitting:   115 / 73  (left arm)  Vitals Entered By: Kem Parkinson (September 24, 2010 2:07 PM)  Physical Exam  General:  Affect appropriate Healthy:  appears stated age HEENT: normal Neck supple with no adenopathy JVP normal bilateral  bruits no thyromegaly Lungs rhonchi and expitory wheezing and good diaphragmatic motion Heart:  S1/S2 AS murmur,rub, gallop or click PMI normal Abdomen: benighn, BS positve, no tenderness, no AAA no bruit.  No HSM or HJR Distal pulses intact with no bruits No edema Neuro non-focal Skin warm and dry    Impression & Recommendations:  Problem # 1:  COUGH (ICD-786.2) Bronchitis and COPD.  Avelox.  F/U Katrinka Blazing at [place].  Check CXR Her updated medication list for this problem includes:    Bisoprolol Fumarate 10 Mg Tabs (Bisoprolol fumarate) .Marland Kitchen... 1 by mouth daily    Aspirin 81 Mg Tabs (Aspirin) .Marland Kitchen... Daily    Isosorbide Mononitrate Cr 30 Mg Xr24h-tab (Isosorbide mononitrate) .Marland Kitchen... Daily    Enalapril Maleate 10 Mg Tabs (Enalapril maleate) .Marland Kitchen..Marland Kitchen Two times a day    Nitrostat 0.4 Mg Subl (Nitroglycerin) .Marland Kitchen... As needed  Orders: T-2 View CXR (71020TC)  Problem # 2:  CAROTID ARTERY DISEASE (ICD-433.10)  F/U duplex as indicated Her updated medication list for this problem includes:    Aspirin 81 Mg Tabs (Aspirin) .Marland Kitchen... Daily  Her updated medication list for this problem includes:    Aspirin 81 Mg Tabs (Aspirin) .Marland Kitchen... Daily  Problem # 3:  CORONARY ARTERY DISEASE (ICD-414.00) Stable no aongina Her updated medication list for this problem includes:    Bisoprolol Fumarate 10 Mg Tabs  (Bisoprolol fumarate) .Marland Kitchen... 1 by mouth daily    Aspirin 81 Mg Tabs (Aspirin) .Marland Kitchen... Daily    Isosorbide Mononitrate Cr 30 Mg Xr24h-tab (Isosorbide mononitrate) .Marland Kitchen... Daily    Enalapril Maleate 10 Mg Tabs (Enalapril maleate) .Marland Kitchen..Marland Kitchen Two times a day    Nitrostat 0.4 Mg Subl (Nitroglycerin) .Marland Kitchen... As needed  Problem # 4:  CARDIOMYOPATHY, ISCHEMIC (ICD-414.8) Does not appear to be volume overloaded.  Check BNP and CXR  Adjust lasix if elevated Her updated medication list for this problem includes:    Bisoprolol Fumarate 10 Mg Tabs (Bisoprolol fumarate) .Marland Kitchen... 1 by mouth daily    Aspirin 81 Mg Tabs (Aspirin) .Marland Kitchen... Daily    Isosorbide Mononitrate Cr 30 Mg Xr24h-tab (Isosorbide mononitrate) .Marland Kitchen... Daily    Enalapril Maleate 10 Mg Tabs (Enalapril maleate) .Marland Kitchen..Marland Kitchen Two times a day    Furosemide 80 Mg Tabs (Furosemide) .Marland Kitchen..Marland Kitchen Two times a day    Nitrostat 0.4 Mg Subl (Nitroglycerin) .Marland Kitchen... As needed  Problem # 5:  HYPERTENSION (ICD-401.9)  Well controlled Her updated medication list for this problem includes:    Bisoprolol Fumarate 10 Mg Tabs (Bisoprolol fumarate) .Marland Kitchen... 1 by mouth daily    Aspirin 81 Mg Tabs (Aspirin) .Marland Kitchen... Daily    Clonidine Hcl 0.1 Mg Tabs (Clonidine hcl) .Marland Kitchen..Marland Kitchen Two times a day    Enalapril Maleate 10 Mg Tabs (Enalapril maleate) .Marland Kitchen..Marland Kitchen Two times a day    Furosemide 80 Mg Tabs (Furosemide) .Marland Kitchen..Marland Kitchen Two times a day  Her updated medication list for this problem includes:    Bisoprolol Fumarate 10 Mg Tabs (Bisoprolol fumarate) .Marland Kitchen... 1 by mouth daily    Aspirin 81 Mg Tabs (Aspirin) .Marland Kitchen... Daily    Clonidine Hcl 0.1 Mg Tabs (Clonidine hcl) .Marland Kitchen..Marland Kitchen Two times a day    Enalapril Maleate 10 Mg Tabs (Enalapril maleate) .Marland Kitchen..Marland Kitchen Two times a day    Furosemide 80 Mg Tabs (Furosemide) .Marland Kitchen..Marland Kitchen Two times a day  Problem # 6:  AORTIC VALVE DISORDERS (ICD-424.1) F/U echo in 6 months.   Her updated medication list for this problem includes:    Bisoprolol Fumarate 10 Mg Tabs (Bisoprolol fumarate) .Marland Kitchen... 1 by mouth  daily    Isosorbide Mononitrate Cr 30 Mg Xr24h-tab (Isosorbide mononitrate) .Marland Kitchen... Daily    Enalapril Maleate 10 Mg Tabs (Enalapril maleate) .Marland Kitchen..Marland Kitchen Two times a day    Furosemide 80 Mg Tabs (Furosemide) .Marland Kitchen..Marland Kitchen Two times a day    Nitrostat 0.4 Mg Subl (Nitroglycerin) .Marland Kitchen... As needed  Patient Instructions: 1)  Your physician recommends that you return for lab work ZO:XWRUE BMET BNP CBC  786.2 2)  Your physician has recommended you make the following change in your medication: ADD AVELOX 400 MG 1 once daily  3)  A chest x-ray takes a picture of the organs and structures inside the chest, including the heart, lungs, and blood vessels. This test can show several things, including, whether the heart is enlarged; whether fluid is building up in the lungs; and whether pacemaker / defibrillator leads are still in place. 4)  Your physician recommends that you schedule a follow-up appointment in: PT TO FOLLOW UP WITH DR Katrinka Blazing Prescriptions: AVELOX 400 MG TABS (MOXIFLOXACIN HCL) 1 once daily  #7 x 0   Entered by:   Scherrie Bateman, LPN   Authorized by:   Colon Branch, MD, [facility]   Signed by:   Scherrie Bateman, LPN on 45/40/9811   Method used:   Electronically to        [facility]. [phone]* (retail)       [address]       [place], [place]  [address]       Ph: [phone]       Fax: [phone]   RxID:   [id number]

## 2010-12-16 ENCOUNTER — Emergency Department (HOSPITAL_COMMUNITY): Payer: Medicare Other

## 2010-12-16 ENCOUNTER — Inpatient Hospital Stay (HOSPITAL_COMMUNITY)
Admission: EM | Admit: 2010-12-16 | Discharge: 2010-12-25 | DRG: 193 | Disposition: A | Discharge status: Home Health | Payer: Medicare Other | Attending: Internal Medicine | Admitting: Internal Medicine

## 2010-12-16 DIAGNOSIS — I252 Old myocardial infarction: Secondary | ICD-10-CM

## 2010-12-16 DIAGNOSIS — E139 Other specified diabetes mellitus without complications: Secondary | ICD-10-CM | POA: Diagnosis not present

## 2010-12-16 DIAGNOSIS — J189 Pneumonia, unspecified organism: Principal | ICD-10-CM | POA: Diagnosis present

## 2010-12-16 DIAGNOSIS — I10 Essential (primary) hypertension: Secondary | ICD-10-CM | POA: Diagnosis present

## 2010-12-16 DIAGNOSIS — R7989 Other specified abnormal findings of blood chemistry: Secondary | ICD-10-CM

## 2010-12-16 DIAGNOSIS — Z23 Encounter for immunization: Secondary | ICD-10-CM

## 2010-12-16 DIAGNOSIS — I5023 Acute on chronic systolic (congestive) heart failure: Secondary | ICD-10-CM | POA: Diagnosis present

## 2010-12-16 DIAGNOSIS — F17200 Nicotine dependence, unspecified, uncomplicated: Secondary | ICD-10-CM | POA: Diagnosis present

## 2010-12-16 DIAGNOSIS — J9620 Acute and chronic respiratory failure, unspecified whether with hypoxia or hypercapnia: Secondary | ICD-10-CM | POA: Diagnosis present

## 2010-12-16 DIAGNOSIS — I2510 Atherosclerotic heart disease of native coronary artery without angina pectoris: Secondary | ICD-10-CM | POA: Diagnosis present

## 2010-12-16 DIAGNOSIS — T380X5A Adverse effect of glucocorticoids and synthetic analogues, initial encounter: Secondary | ICD-10-CM | POA: Diagnosis not present

## 2010-12-16 DIAGNOSIS — I509 Heart failure, unspecified: Secondary | ICD-10-CM | POA: Diagnosis present

## 2010-12-16 DIAGNOSIS — R0902 Hypoxemia: Secondary | ICD-10-CM | POA: Diagnosis present

## 2010-12-16 DIAGNOSIS — J441 Chronic obstructive pulmonary disease with (acute) exacerbation: Secondary | ICD-10-CM | POA: Diagnosis present

## 2010-12-16 DIAGNOSIS — I080 Rheumatic disorders of both mitral and aortic valves: Secondary | ICD-10-CM | POA: Diagnosis present

## 2010-12-16 DIAGNOSIS — Z8673 Personal history of transient ischemic attack (TIA), and cerebral infarction without residual deficits: Secondary | ICD-10-CM

## 2010-12-16 DIAGNOSIS — I714 Abdominal aortic aneurysm, without rupture, unspecified: Secondary | ICD-10-CM | POA: Diagnosis present

## 2010-12-16 DIAGNOSIS — Z7901 Long term (current) use of anticoagulants: Secondary | ICD-10-CM

## 2010-12-16 DIAGNOSIS — Z9981 Dependence on supplemental oxygen: Secondary | ICD-10-CM

## 2010-12-16 DIAGNOSIS — I701 Atherosclerosis of renal artery: Secondary | ICD-10-CM | POA: Diagnosis present

## 2010-12-16 DIAGNOSIS — E871 Hypo-osmolality and hyponatremia: Secondary | ICD-10-CM | POA: Diagnosis present

## 2010-12-16 DIAGNOSIS — I2582 Chronic total occlusion of coronary artery: Secondary | ICD-10-CM | POA: Diagnosis present

## 2010-12-16 DIAGNOSIS — R0989 Other specified symptoms and signs involving the circulatory and respiratory systems: Secondary | ICD-10-CM

## 2010-12-16 DIAGNOSIS — N289 Disorder of kidney and ureter, unspecified: Secondary | ICD-10-CM | POA: Diagnosis not present

## 2010-12-16 DIAGNOSIS — N179 Acute kidney failure, unspecified: Secondary | ICD-10-CM | POA: Diagnosis not present

## 2010-12-16 DIAGNOSIS — E785 Hyperlipidemia, unspecified: Secondary | ICD-10-CM | POA: Diagnosis present

## 2010-12-16 DIAGNOSIS — R0609 Other forms of dyspnea: Secondary | ICD-10-CM

## 2010-12-16 DIAGNOSIS — Z7982 Long term (current) use of aspirin: Secondary | ICD-10-CM

## 2010-12-16 DIAGNOSIS — I214 Non-ST elevation (NSTEMI) myocardial infarction: Secondary | ICD-10-CM | POA: Diagnosis present

## 2010-12-16 DIAGNOSIS — I739 Peripheral vascular disease, unspecified: Secondary | ICD-10-CM | POA: Diagnosis present

## 2010-12-16 DIAGNOSIS — I2589 Other forms of chronic ischemic heart disease: Secondary | ICD-10-CM | POA: Diagnosis present

## 2010-12-16 DIAGNOSIS — I25810 Atherosclerosis of coronary artery bypass graft(s) without angina pectoris: Secondary | ICD-10-CM | POA: Diagnosis present

## 2010-12-16 LAB — CARDIAC PANEL(CRET KIN+CKTOT+MB+TROPI)
CK, MB: 88.2 ng/mL (ref 0.3–4.0)
Relative Index: 27.1 — ABNORMAL HIGH (ref 0.0–2.5)
Total CK: 325 U/L — ABNORMAL HIGH (ref 7–177)
Troponin I: 3.44 ng/mL (ref 0.00–0.06)

## 2010-12-16 LAB — BRAIN NATRIURETIC PEPTIDE: Pro B Natriuretic peptide (BNP): 493 pg/mL — ABNORMAL HIGH (ref 0.0–100.0)

## 2010-12-16 LAB — COMPREHENSIVE METABOLIC PANEL
CO2: 27 mEq/L (ref 19–32)
Calcium: 8.8 mg/dL (ref 8.4–10.5)
Creatinine, Ser: 0.78 mg/dL (ref 0.4–1.2)
GFR calc Af Amer: >60 mL/min (ref >60)
GFR calc non Af Amer: >60 mL/min (ref >60)
Glucose, Bld: 114 mg/dL — ABNORMAL HIGH (ref 70–99)

## 2010-12-16 LAB — TSH: TSH: 0.351 u[IU]/mL (ref 0.350–4.500)

## 2010-12-16 LAB — CBC
Hemoglobin: 12.5 g/dL (ref 12.0–15.0)
MCHC: 33.7 g/dL (ref 30.0–36.0)
RDW: 14.1 % (ref 11.5–15.5)
WBC: 9.9 10*3/uL (ref 4.0–10.5)

## 2010-12-16 LAB — POCT CARDIAC MARKERS
CKMB, poc: 2.1 ng/mL (ref 1.0–8.0)
CKMB, poc: 2.1 ng/mL (ref 1.0–8.0)
Myoglobin, poc: 65.3 ng/mL (ref 12–200)
Myoglobin, poc: 61.5 ng/mL (ref 12–200)

## 2010-12-16 LAB — DIFFERENTIAL
Basophils Absolute: 0.1 10*3/uL (ref 0.0–0.1)
Basophils Relative: 1 % (ref 0–1)
Monocytes Absolute: 0.9 10*3/uL (ref 0.1–1.0)
Neutro Abs: 5.9 10*3/uL (ref 1.7–7.7)

## 2010-12-16 MED ORDER — IOHEXOL 300 MG/ML  SOLN
75.0000 mL | Freq: Once | INTRAMUSCULAR | Status: AC | PRN
  Administered 2010-12-16: 75 mL via INTRAVENOUS

## 2010-12-17 DIAGNOSIS — I214 Non-ST elevation (NSTEMI) myocardial infarction: Secondary | ICD-10-CM

## 2010-12-17 DIAGNOSIS — I059 Rheumatic mitral valve disease, unspecified: Secondary | ICD-10-CM

## 2010-12-17 LAB — CARDIAC PANEL(CRET KIN+CKTOT+MB+TROPI)
CK, MB: 126.8 ng/mL (ref 0.3–4.0)
Relative Index: 26.7 — ABNORMAL HIGH (ref 0.0–2.5)
Total CK: 475 U/L — ABNORMAL HIGH (ref 7–177)

## 2010-12-17 LAB — BASIC METABOLIC PANEL
BUN: 19 mg/dL (ref 6–23)
Chloride: 101 mEq/L (ref 96–112)
Potassium: 5 mEq/L (ref 3.5–5.1)

## 2010-12-17 LAB — HEPARIN LEVEL (UNFRACTIONATED): Heparin Unfractionated: <0.1 IU/mL — ABNORMAL LOW (ref 0.30–0.70)

## 2010-12-17 LAB — CBC
Hemoglobin: 12.4 g/dL (ref 12.0–15.0)
MCH: 28.6 pg (ref 26.0–34.0)
MCV: 85.7 fL (ref 78.0–100.0)
RBC: 4.33 MIL/uL (ref 3.87–5.11)

## 2010-12-17 LAB — LIPID PANEL
Cholesterol: 171 mg/dL (ref 0–200)
HDL: 56 mg/dL (ref >39)
Total CHOL/HDL Ratio: 3.1 RATIO

## 2010-12-18 LAB — BASIC METABOLIC PANEL
BUN: 34 mg/dL — ABNORMAL HIGH (ref 6–23)
GFR calc Af Amer: 42 mL/min — ABNORMAL LOW (ref >60)
GFR calc non Af Amer: 34 mL/min — ABNORMAL LOW (ref >60)
Potassium: 4.3 mEq/L (ref 3.5–5.1)
Sodium: 133 mEq/L — ABNORMAL LOW (ref 135–145)

## 2010-12-18 LAB — CBC
Platelets: 399 10*3/uL (ref 150–400)
RBC: 4.21 MIL/uL (ref 3.87–5.11)
RDW: 14.7 % (ref 11.5–15.5)
WBC: 30.8 10*3/uL — ABNORMAL HIGH (ref 4.0–10.5)

## 2010-12-18 LAB — HEPARIN LEVEL (UNFRACTIONATED): Heparin Unfractionated: 0.39 IU/mL (ref 0.30–0.70)

## 2010-12-19 DIAGNOSIS — I214 Non-ST elevation (NSTEMI) myocardial infarction: Secondary | ICD-10-CM

## 2010-12-19 DIAGNOSIS — I208 Other forms of angina pectoris: Secondary | ICD-10-CM

## 2010-12-19 DIAGNOSIS — R0602 Shortness of breath: Secondary | ICD-10-CM

## 2010-12-19 DIAGNOSIS — J441 Chronic obstructive pulmonary disease with (acute) exacerbation: Secondary | ICD-10-CM

## 2010-12-19 DIAGNOSIS — J189 Pneumonia, unspecified organism: Secondary | ICD-10-CM

## 2010-12-19 LAB — BLOOD GAS, ARTERIAL
Acid-Base Excess: 0.2 mmol/L (ref 0.0–2.0)
Patient temperature: 98.6
TCO2: 25.7 mmol/L (ref 0–100)
pH, Arterial: 7.401 — ABNORMAL HIGH (ref 7.350–7.400)

## 2010-12-19 LAB — BASIC METABOLIC PANEL
CO2: 26 mEq/L (ref 19–32)
Calcium: 9 mg/dL (ref 8.4–10.5)
GFR calc Af Amer: 38 mL/min — ABNORMAL LOW (ref >60)
GFR calc non Af Amer: 31 mL/min — ABNORMAL LOW (ref >60)
Potassium: 4.8 mEq/L (ref 3.5–5.1)
Sodium: 131 mEq/L — ABNORMAL LOW (ref 135–145)

## 2010-12-19 LAB — CBC
Hemoglobin: 11.1 g/dL — ABNORMAL LOW (ref 12.0–15.0)
MCHC: 32.6 g/dL (ref 30.0–36.0)
RBC: 3.94 MIL/uL (ref 3.87–5.11)
WBC: 20.4 10*3/uL — ABNORMAL HIGH (ref 4.0–10.5)

## 2010-12-19 LAB — CARDIAC PANEL(CRET KIN+CKTOT+MB+TROPI)
Relative Index: INVALID (ref 0.0–2.5)
Total CK: 83 U/L (ref 7–177)

## 2010-12-19 LAB — HEPARIN LEVEL (UNFRACTIONATED): Heparin Unfractionated: 0.37 IU/mL (ref 0.30–0.70)

## 2010-12-20 LAB — BASIC METABOLIC PANEL
BUN: 38 mg/dL — ABNORMAL HIGH (ref 6–23)
CO2: 26 mEq/L (ref 19–32)
Calcium: 9.1 mg/dL (ref 8.4–10.5)
Creatinine, Ser: 1.34 mg/dL — ABNORMAL HIGH (ref 0.4–1.2)
Glucose, Bld: 158 mg/dL — ABNORMAL HIGH (ref 70–99)

## 2010-12-20 LAB — CBC
HCT: 33.8 % — ABNORMAL LOW (ref 36.0–46.0)
MCHC: 32.8 g/dL (ref 30.0–36.0)
MCV: 85.6 fL (ref 78.0–100.0)
RDW: 14.7 % (ref 11.5–15.5)

## 2010-12-21 LAB — BASIC METABOLIC PANEL
CO2: 28 mEq/L (ref 19–32)
Calcium: 9.1 mg/dL (ref 8.4–10.5)
Creatinine, Ser: 1.09 mg/dL (ref 0.4–1.2)
GFR calc Af Amer: 59 mL/min — ABNORMAL LOW (ref >60)

## 2010-12-21 LAB — HEPARIN LEVEL (UNFRACTIONATED): Heparin Unfractionated: 0.47 IU/mL (ref 0.30–0.70)

## 2010-12-21 LAB — CBC
MCH: 28.4 pg (ref 26.0–34.0)
MCHC: 32.7 g/dL (ref 30.0–36.0)
Platelets: 352 10*3/uL (ref 150–400)

## 2010-12-22 DIAGNOSIS — I2510 Atherosclerotic heart disease of native coronary artery without angina pectoris: Secondary | ICD-10-CM

## 2010-12-22 LAB — CBC
MCV: 87.8 fL (ref 78.0–100.0)
Platelets: 385 10*3/uL (ref 150–400)
RDW: 14.9 % (ref 11.5–15.5)
WBC: 17.1 10*3/uL — ABNORMAL HIGH (ref 4.0–10.5)

## 2010-12-22 LAB — POCT ACTIVATED CLOTTING TIME: Activated Clotting Time: 111 seconds

## 2010-12-22 LAB — PROTIME-INR: INR: 0.97 (ref 0.00–1.49)

## 2010-12-22 LAB — BASIC METABOLIC PANEL
BUN: 31 mg/dL — ABNORMAL HIGH (ref 6–23)
Chloride: 97 mEq/L (ref 96–112)
Creatinine, Ser: 1.19 mg/dL (ref 0.4–1.2)
Glucose, Bld: 98 mg/dL (ref 70–99)

## 2010-12-22 NOTE — H&P (Signed)
[name], [name]               ACCOUNT NO.:  [id number]  MEDICAL RECORD NO.:  [id number]           PATIENT TYPE:  E  LOCATION:  [facility]                         FACILITY:  [facility]  PHYSICIAN:  Isidor Holts, M.D.  DATE OF BIRTH:  12-18-1933  DATE OF ADMISSION:  12/16/2010 DATE OF DISCHARGE:                             HISTORY & PHYSICAL   PRIMARY MD:  Dario Guardian, MD  PRIMARY VASCULAR SURGEON:  Di Kindle. Edilia Bo, MD  PRIMARY CARDIOLOGIST:  Rollene Rotunda, MD, [facility].  CHIEF COMPLAINT:  Increased shortness of breath as well as productive cough over 1 week.  HISTORY OF PRESENT ILLNESS:  This is a 77 year old female, with known history of COPD, who continues to smoke, unfortunately.  She developed cough, productive of yellowish phlegm approximately 1 week ago.  Denies any fever or chills, but felt occasionally "hot."  She saw her primary MD approximately 1 week ago and was started on Biaxin at that time, which she has been compliant with, however, symptoms have got progressively worse.  In the a.m. of December 16, 2010, she could not "catch my breath," so her stepson called the EMS and she was brought to the emergency department.  PAST MEDICAL HISTORY: 1. COPD. 2. A 4.7 cm abdominal aortic aneurysm and 2.5 cm left common iliac     aneurysm.  Has regular followup with vascular surgeon. 3. Hypertension. 4. Coronary artery disease status post CABG in 1996, status post     stents. 5. Status post MI in 2005. 6. History of systolic congestive heart failure, ejection fraction 35%     to 40% per 2-D echocardiogram on December 2009. 7. Smoking history. 8. Dyslipidemia. 9. History of gallstone pancreatitis, July 2007. 10.Status post laparoscopic cholecystectomy for     cholelithiasis/cholecystitis, March 30, 2006. 11.Status post intramedullary pin for right ankle fracture remotely.  ALLERGIES: 1. SULFA. 2. CODEINE.  MEDICATIONS: 1. Biaxin 500 mg p.o. daily. 2. Bisoprolol  fumarate 10 mg p.o. daily. 3. Crestor 40 mg p.o. daily. 4. Enalapril malleate 10 mg p.o. b.i.d. 5. Furosemide 80 mg p.o. b.i.d. 6. Isosorbide mononitrate 30 mg p.o. daily. 7. Nitrostat 0.4 mg sublingually p.r.n. q. 5 minutes for chest pain.     This medication list will of course be updated by clinical     pharmacologist in due course.  REVIEW OF SYSTEMS:  As per HPI and chief complaint, the patient denies chest pain.  Denies abdominal pain, vomiting, or diarrhea.  Denies headache.  Rest of systems review is negative.  SOCIAL HISTORY:  The patient used to work as a Surveyor, mining.  She is a retired and is widowed.  Has three offspring.  Drinks alcohol only occasionally, however, smokes about 8 packets of cigarettes per day. Has done since the age of 42 years.  No history of drug abuse.  FAMILY HISTORY:  Mother passed away at 27 years during pregnancy due to kidney disease.  Father passed away at age 13 years, status post MI.  No other history is known.  PHYSICAL EXAMINATION:  VITALS:  Temperature 98.1, pulse 145 per minute, respiratory rate 26, BP 131/73 mmHg, pulse  oximeter 93% on 2 L of oxygen. GENERAL:  The patient did not appear to be in obvious acute distress at the time of this evaluation following bronchodilator, nebulizers as mentioned in the emergency department, although she is mildly short of breath.  She is able to talk in complete sentences and accessory respiratory muscles do not appear to be in play. HEENT:  No clinical pallor.  No jaundice.  No conjunctival injection. Hydration status appears fair. NECK:  Supple.  JVP not seen.  No palpable lymphadenopathy.  No palpable goiter. CHEST:  Clinically clear to auscultation.  No wheezes or crackles. HEART:  S1, S2 are heard, normal, tachycardic.  No murmurs. ABDOMEN:  Full, soft, and nontender.  No palpable organomegaly or palpable masses.  Normal bowel sounds. LOWER EXTREMITY:  No pitting edema.  Palpable  peripheral pulses.  The patient does have varicosities. MUSCULOSKELETAL SYSTEM:  Unremarkable apart for generalized osteoarthritic changes. CENTRAL NERVOUS SYSTEM:  No focal neurologic deficits on gross examination.  INVESTIGATIONS:  CBC:  WBC 9.9, hemoglobin 12.5, hematocrit 37.1, platelets 349.  Electrolytes:  Sodium 139, potassium 3.5, chloride 103. CO2 of 27, BUN 5, creatinine 0.78, glucose 114, BNP 493.  Troponin-I point-of-care 0.07.  LFTs are normal.  Chest x-ray, December 16, 2010 shows minimal cardiomegaly, elevated right hemidiaphragm with minimal right basilar infiltrate - atelectasis.  No consolidation.  There was hyperinflation.  12-lead EKG, December 16, 2010, shows sinus rhythm. Ventricular trigeminy, left axis deviation, no acute ischemic changes, rate 118 per minute.  ASSESSMENT AND PLAN: 1. Acute bronchitis versus possible community-acquired right lower lobe     pneumonia.  The patient has obviously failed outpatient treatment.     We shall therefore admit her and manage with intravenous Rocephin,     azithromycin, as well as utilization of Mucinex.  2. Infective exacerbation of chronic obstructive pulmonary disease,     secondary to acute bronchitis.  We shall manage as described     above.  However, utilize parenteral steroids, bronchodilator     nebulizers, and oxygen supplementation.  3. Smoking history.  Unfortunately, the patient continues to smoke a     pack of cigarettes per day.  She has been counseled appropriately     and we shall manage her with NicoDerm CQ patch.  4. Coronary artery disease.  The patient has no chest pain at the     present time.  12-lead EKG shows no acute ischemic changes.  First     set of cardiac enzymes at point of care, appear normal.  The patient     clinically does not appear to have acute coronary syndrome at this time.  We     shall monitor telemetrically.  5. History of systolic congestive heart failure, likely secondary to      ischemic cardiomyopathy.  Clinically, the patient appears     compensated, although there is mild elevation of BNP.  We shall     continue preadmission diuretics and observe.  Cardiology input is not indicated at the     present time.  6. Dyslipidemia.  We shall check lipid profile for completeness and     TSH.  Meanwhile, continue preadmission statin.    Further management will depend on clinical course.     Isidor Holts, M.D.     CO/MEDQ  D:  12/16/2010  T:  12/16/2010  Job:  [id number]  cc:   Dario Guardian, M.D. Di Kindle. Edilia Bo, M.D. Rollene Rotunda, MD, [facility]  Electronically Signed by  Isidor Holts M.D. on 12/22/2010 04:37:24 PM

## 2010-12-23 DIAGNOSIS — R0602 Shortness of breath: Secondary | ICD-10-CM

## 2010-12-23 DIAGNOSIS — J441 Chronic obstructive pulmonary disease with (acute) exacerbation: Secondary | ICD-10-CM

## 2010-12-23 DIAGNOSIS — I214 Non-ST elevation (NSTEMI) myocardial infarction: Secondary | ICD-10-CM

## 2010-12-23 LAB — BASIC METABOLIC PANEL
BUN: 25 mg/dL — ABNORMAL HIGH (ref 6–23)
Calcium: 9 mg/dL (ref 8.4–10.5)
Creatinine, Ser: 1.15 mg/dL (ref 0.4–1.2)
GFR calc non Af Amer: 46 mL/min — ABNORMAL LOW (ref >60)
Glucose, Bld: 152 mg/dL — ABNORMAL HIGH (ref 70–99)

## 2010-12-23 LAB — CBC
HCT: 38.8 % (ref 36.0–46.0)
MCH: 27.7 pg (ref 26.0–34.0)
MCHC: 31.7 g/dL (ref 30.0–36.0)
MCV: 87.4 fL (ref 78.0–100.0)
RDW: 15 % (ref 11.5–15.5)

## 2010-12-24 DIAGNOSIS — I4891 Unspecified atrial fibrillation: Secondary | ICD-10-CM

## 2010-12-24 LAB — CBC
MCH: 27.8 pg (ref 26.0–34.0)
MCHC: 32.1 g/dL (ref 30.0–36.0)
RDW: 14.9 % (ref 11.5–15.5)

## 2010-12-24 LAB — BASIC METABOLIC PANEL
BUN: 27 mg/dL — ABNORMAL HIGH (ref 6–23)
Calcium: 9.3 mg/dL (ref 8.4–10.5)
Calcium: 8.8 mg/dL (ref 8.4–10.5)
Creatinine, Ser: 1.22 mg/dL — ABNORMAL HIGH (ref 0.4–1.2)
GFR calc Af Amer: 52 mL/min — ABNORMAL LOW (ref >60)
GFR calc non Af Amer: 43 mL/min — ABNORMAL LOW (ref >60)
GFR calc non Af Amer: 50 mL/min — ABNORMAL LOW (ref >60)
Potassium: 4.1 mEq/L (ref 3.5–5.1)
Sodium: 138 mEq/L (ref 135–145)
Sodium: 134 mEq/L — ABNORMAL LOW (ref 135–145)

## 2010-12-24 LAB — MAGNESIUM: Magnesium: 2.3 mg/dL (ref 1.5–2.5)

## 2010-12-25 LAB — CBC
HCT: 39.7 % (ref 36.0–46.0)
Hemoglobin: 12.8 g/dL (ref 12.0–15.0)
MCH: 28.1 pg (ref 26.0–34.0)
MCV: 87.3 fL (ref 78.0–100.0)
RBC: 4.55 MIL/uL (ref 3.87–5.11)

## 2010-12-26 ENCOUNTER — Encounter: Payer: Medicare Other | Admitting: *Deleted

## 2010-12-29 ENCOUNTER — Ambulatory Visit (INDEPENDENT_AMBULATORY_CARE_PROVIDER_SITE_OTHER): Payer: Medicare Other | Admitting: *Deleted

## 2010-12-29 ENCOUNTER — Other Ambulatory Visit (INDEPENDENT_AMBULATORY_CARE_PROVIDER_SITE_OTHER): Payer: Medicare Other | Admitting: *Deleted

## 2010-12-29 ENCOUNTER — Encounter: Payer: Self-pay | Admitting: Cardiology

## 2010-12-29 DIAGNOSIS — R0989 Other specified symptoms and signs involving the circulatory and respiratory systems: Secondary | ICD-10-CM

## 2010-12-29 DIAGNOSIS — I4891 Unspecified atrial fibrillation: Secondary | ICD-10-CM

## 2010-12-29 DIAGNOSIS — G459 Transient cerebral ischemic attack, unspecified: Secondary | ICD-10-CM

## 2010-12-29 LAB — POCT INR: INR: 2.1

## 2010-12-30 ENCOUNTER — Telehealth: Payer: Self-pay | Admitting: Internal Medicine

## 2010-12-30 LAB — DIGOXIN LEVEL: Digoxin Level: 0.3 ng/mL — ABNORMAL LOW (ref 0.8–2.0)

## 2010-12-30 NOTE — Telephone Encounter (Signed)
Called, spoke with pt.  States she was discharged from hospital on Thursday with o2.  She was seen by MR in the hospital and has a f/u with him on May 8.  Pt states she was seen by cards yesterday for coumadin check and was told to call here to see about getting a small portable o2 system.  States the tanks she has now are just too big for her.  She would like this ordered thru [facility] ASAP but aware MR is out of office until Friday.  MR -- pls advise if ok to send order to [facility] for this.  Thanks!

## 2010-12-30 NOTE — Consult Note (Signed)
[name]               ACCOUNT NO.:  [id number]  MEDICAL RECORD NO.:  [id number]           PATIENT TYPE:  I  LOCATION:  2922                         FACILITY:  [facility]  PHYSICIAN:  Kalman Shan, MD   DATE OF BIRTH:  Jul 28, 1934  DATE OF CONSULTATION: DATE OF DISCHARGE:                                CONSULTATION   REQUESTING PHYSICIAN:  Brendia Sacks, MD  CHIEF COMPLAINT:  Consult requested for COPD management failure to resolve from recent pneumonia and acute COPD exacerbation and persistent shortness of breath.  HISTORY OF PRESENT ILLNESS:  [name]  is a 77 year old female with COPD who has never seen a pulmonologist, continued tobacco abuse, coronary artery disease status post CABG whose singular presentation of angina is dyspnea.  At baseline, she gets dyspneic when she walks half a block and has some amount of cough with Kho phlegm.  Starting around 2 weeks ago, she started having worsening dyspnea on exertion and at the time of admission on December 16, 2010, she had class IV dyspnea.  One week prior to admission, she also started having yellow phlegm and cough but denied any fever, chills, but was feeling hot.  She was started on Biaxin 1 week prior to presentation but because of worsening symptoms she got admitted.  A CT scan of the chest showed right lower lobe pneumonic infiltrates and she has been started on Rocephin and Zithromax along with IV steroids for COPD exacerbation and pneumonia.  Since admission she feels improved although class IV dyspnea persists.  She only feels 20% towards her baseline.  Nursing notes that she still desaturates when her oxygen is turned off.  Of note, she was not on home oxygen.  She denies any fever, chills, chest pain, edema, syncope, hemoptysis.  She has continued to remain in step-down since admission. There is some element of systolic congestive heart failure.  Her BNP is bumped up from 100-400 since  admission.  PAST MEDICAL HISTORY: 1. Coronary artery disease. 2. Systolic congestive heart failure echo 2009, with EF 35-40%. 3. Peripheral vascular disease. 4. Renal artery stenosis. 5. Carotid artery stenosis status post carotid endarterectomy. 6. Abdominal aortic aneurysm. 7. Coronary artery calcification seen on CT scan. 8. History of TIA. 9. Hypertension. 10.Hyperlipidemia. 11.Ongoing tobacco abuse. 12.History of gallstone status post cholecystectomy.  ALLERGIES:  SULFA and CODEINE.  MEDICATIONS:  At the time of admission include Biaxin, bisoprolol, Crestor, enalapril, Lasix, Imdur and nitroglycerin.  CURRENT MEDICATIONS:  At this hospitalization include Rocephin, Zithromax, Vasotec, Crestor, Cardizem, Zebeta, Imdur, Xopenex and Atrovent q.4 hours inhaler, aspirin, Lasix, subcutaneous heparin, Plavix and IV Solu-Medrol 60 mg q.12 hours, Mucinex and NicoDerm patch.  SOCIAL HISTORY:  She is an immigrant from [place].  She is retired.  She lives alone.  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  Drinks alcohol occasionally.  Daughter lives in [place].  FAMILY HISTORY:  Denies any COPD, emphysema.  Mother passed away at age 51 during pregnancy.  Father passed away at age 57 due to MI.  PHYSICAL EXAMINATION:  VITAL SIGNS:  Her temperature is  97.4, heart rate is 95 sinus, respiratory rate is 16, pulse ox 96% on 2 liters. GENERAL:  Chronically unwell looking female, short. PSYCHIATRIC:  Pleasant, no distress, cheerful. NEUROLOGIC:  Alert and oriented x3.  Glasgow Coma Scale 15.  Moves all four extremities. HEENT:  Supple neck, prominent external jugular vein pulsation present, poor dentition present.  JVP is around 10 cm of water.  Has healed right carotid endarterectomy scar. CARDIAC:  Distant heart sounds.  No murmurs.  Regular rate and rhythm. PULMONARY:  Bilateral poor air movement.  Bilateral wheezing present. Right lower lobe with some  crackles present.  No obvious distress. Speaks full sentences.  ABDOMEN:  Soft, nontender.  Good bowel sounds. EXTREMITIES:  No cyanosis, no clubbing, no edema.  LABORATORY EVALUATION:  CT scan of the chest done on December 16, 2010, shows severe emphysema and right lower lobe airspace disease consistent with pneumonitic process.  In addition, she has atherosclerotic heart disease.  Other laboratory evaluation shows hyponatremia with a sodium of 131, creatinine 1.6.  Baseline creatinine is 0.78.  Non-STEMI with troponin at admission of 0.75 with a peak troponin on December 17, 2010, of 9 and current troponin of 4.5.  Mild anemia, hemoglobin 11, [place] count 20.  No ABG.  ASSESSMENT AND PLAN: 1. Acute chronic obstructive pulmonary disease exacerbation. 2. Right lower lobe pneumonic process. 3. Non-ST-elevation myocardial infarction. 4. Multifactorial dyspnea class III at baseline, currently class IV     due to the above.  In addition, deconditioning is contributing to     the process.  Although she has improved, the improvement currently     is only marginal.  PLAN: 1. It appears from the outside that her prognosis overall is bad given     the significant degree of vasculopathy and also COPD.  These two     diseases coexist especially with ongoing cigarette smoking.     Current recommendations include getting a spirometry, blood gas,     getting more aggressive with the bronchodilator therapy, also more     aggressive incentive spirometry in order to prevent pulmonary     atelectasis and continued optimization of cardiac management. 2. I would recommend discontinuing the enalapril and switching it to     angiotensin receptor blockade if Cardiology is okay with it     secondary to her ongoing cough and her COPD. 3. When discharged, she will need Pulmonary rehab, outpatient followup     with Dr. Lavinia Sharps, and alpha-1 antitrypsin check for genetic     causes of COPD.  I also think it  would be useful for her to get a palliative care consult just establish goals of care and to unify that her treatment expectations are consistent with her values and she has a good understanding of her prognosis.  Thank you for the interesting consult.     Kalman Shan, MD     MR/MEDQ  D:  12/19/2010  T:  12/19/2010  Job:  [id number]  cc:   Dario Guardian, M.D. Di Kindle. Edilia Bo, M.D. Rollene Rotunda, MD, [facility]  Electronically Signed by Kalman Shan MD on 12/30/2010 01:23:19 AM

## 2010-12-30 NOTE — Consult Note (Signed)
[name], [name]               ACCOUNT NO.:  [id number]  MEDICAL RECORD NO.:  [id number]           PATIENT TYPE:  I  LOCATION:  3707                         FACILITY:  [facility]  PHYSICIAN:  Bevelyn Buckles. Amarria Andreasen, MDDATE OF BIRTH:  October 06, 1933  DATE OF CONSULTATION:  12/16/2010 DATE OF DISCHARGE:                                CONSULTATION   PRIMARY CARE PHYSICIAN:  Dario Guardian, MD  CARDIOLOGIST:  Rollene Rotunda, MD, [facility]  CONSULTING PHYSICIAN:  Isidor Holts, MD  REASON FOR CONSULTATION:  Dyspnea and increased cardiac markers.  HISTORY OF PRESENT ILLNESS:  Ms. Cudd is a delightful 77 year old woman with multiple medical problems including COPD with ongoing tobacco use, hypertension, peripheral arterial disease, CAD status post bypass surgery in 1996 with a negative Myoview in 2010 and congestive heart failure secondary to ischemic cardiomyopathy with an EF of 35-40% on echocardiogram in 2009.  She reports a 2-week history of increasing dyspnea.  She saw Dr. Antoine Poche approximately 2 weeks ago in the office and he felt it was likely a pulmonary issue.  She was referred to her primary care physician.  She saw Dr. Katrinka Blazing who diagnosed with bronchitis and started antibiotics.  Unfortunately, she continued to get more short of breath with productive cough with yellow sputum and occasional chills.  Two days ago, she had a couple episodes of mild chest pressure, but nothing like her previous angina.  Today, she had severe dyspnea on waking up and was just unable to get across the room.  There was no chest pain associated with this.  She came to the emergency room.  Initial cardiac markers showed a troponin of 0.09 and a CK-MB of 2.1.  EKG just showed sinus tach with no ST T-wave abnormalities.  She underwent CT scan of the chest, which showed COPD.  There was extensive coronary artery disease with some hypodense plaques that were felt to possibly indicate plaque ulceration.   However, there was also note of significant right middle lobe and right lower lobe airspace disease suggestive of early pneumonia versus bronchitis.  Follow-up cardiac markers were obtained, which showed a CK of 151 and MB fraction of 31 and a troponin of 0.75. She was seen by Dr. Brien Few and admitted for probable pneumonia/COPD flare. We are consulted for elevated cardiac markers.  REVIEW OF SYSTEMS:  As above.  She denies any current chest pressure. She did have some slight episode several days ago.  She does say she has had a mild subjective fever.  No lower extremity edema.  She continues to smoke one pack of cigarettes a day.  She does have occasional wheezing and weakness.  As well as arthritis pain.  No bleeding. Remainder review of systems, all systems are negative except for HPI and problem list.  PROBLEM LIST: 1. Coronary artery disease.     a.     Status post bypass surgery in 1996 at [facility].     b.     Last stress test in 2010, which was negative by report.     c.     Last cardiac catheterization in 2004 showed an  EF of 50%. The left main had an 80% ostial stenosis.  The LAD was occluded      after the takeoff of first diagonal.  The circumflex was occluded      proximally and the RCA had a patent stent with a 70% ostial      stenosis and a 90% mid lesion.  The LIMA to LAD was widely patent.      The saphenous vein graft to the OM1 was patent and the saphenous      vein graft to the diagonal was occluded.  She underwent stenting      of the native RCA. 2. Congestive heart failure.     a.     Echocardiogram in 2009 with an EF of 35-40%.     b.     Renal artery stenosis. 3. Peripheral vascular disease.     a.     Carotid artery stenosis status post carotid endarterectomy      on the right in 1998. 4. Abdominal aortic aneurysm. 5. Renal artery stenosis. 6. History of TIAs. 7. Hypertension. 8. Hyperlipidemia. 9. COPD with ongoing tobacco use. 10.History of gallstones  status post cholecystectomy.  ALLERGIES: 1. SULFA. 2. CODEINE.  CURRENT MEDICATIONS AT HOME: 1. Biaxin 500 mg b.i.d. 2. Bisoprolol 10 mg a day. 3. Crestor 40 a day. 4. Enalapril 10 b.i.d. 5. Lasix 80 b.i.d. 6. Imdur 30 a day. 7. Nitroglycerin p.r.n. 8. She is currently on Rocephin and azithromycin for her pneumonia.     Her beta-blocker is on hold.  SOCIAL HISTORY:  She is retired.  She used to work as a Surveyor, mining.  She is widowed.  She drinks alcohol occasionally.  She continues to smoke one pack of cigarettes a day which she has done for 35-40 years.  FAMILY HISTORY:  Mother passed away at age 35 during pregnancy.  Father passed away at 8 due to an MI.  PHYSICAL EXAMINATION:  GENERAL:  She is somewhat chronically ill appearing with a cough. VITAL SIGNS:  She is afebrile.  Blood pressure is 131/73, heart rate is 110, and she is satting 93% on 2 L. HEENT:  Normal except for poor dentition. NECK:  Supple.  JVP is about 10 cm of water.  Carotid, she is status post right endarterectomy with a well-healed scar.  There are bilateral soft bruits.  No thyromegaly appreciated. CARDIAC:  She has distant heart sounds.  Her PMI is not palpable.  She is tachycardic and regular.  No obvious murmurs. LUNGS:  Have poor air movement throughout.  There is no obvious wheezing. ABDOMEN:  Soft, nontender, nondistended.  Good bowel sounds. EXTREMITIES:  Warm with no significant edema.  No rash. NEUROLOGIC:  Alert and oriented x3.  Cranial nerves II through XII are intact.  Moves all 4 extremities without difficulty.  LABORATORY DATA:  Sodium 139, potassium 3.5, BUN of 5, and creatinine 0.7.  Mcmasters count is 9.9, hemoglobin is 12.5, and platelets 349.  BNP is 493, which is up from 182.  Point of care markers shows CK-MB of 2.1 and a troponin is 0.09.  Follow-up cardiac markers; CK 151, MB of 31, and troponin 0.75.  EKG shows sinus tachycardia with trigeminy.  No ST-T wave  abnormalities.  ASSESSMENT: 1. Pneumonia. 2. Non-ST elevation myocardial infarction. 3. Acute on chronic systolic heart failure. 4. Chronic obstructive pulmonary disease with ongoing tobacco use. 5. Acute on chronic respiratory failure. 6. Coronary artery disease status post coronary artery bypass grafting  in 1996 and RCA stents.  Last catheterization in 2004. 7. Ischemic cardiomyopathy with an EF of 35-40%.  DISCUSSION AND PLAN:  Ms. Kirker dyspnea is clearly multifactorial with pneumonia, congestive heart failure, and non-STEMI.  It is hard to know if the non-STEMI is primary or secondary.  Currently, I think it appears secondary.  We will transfer to 2900 step-down.  We will treat with aspirin and heparin.  We will continue her antibiotics.  We will not use beta-blocker due to COPD, but we will treat her gently with calcium channel blocker.  We will also start IV Lasix to diurese.  She will need a cath prior to discharge.  I have cardiac enzymes increasing briskly, he will likely need cast sooner rather than later.  We will follow closely.  We appreciate the consult.     Bevelyn Buckles. Balinda Heacock, MD     DRB/MEDQ  D:  12/16/2010  T:  12/16/2010  Job:  [id number]  cc:   Dario Guardian, M.D. Rollene Rotunda, MD, [facility] Isidor Holts, M.D.  Electronically Signed by Arvilla Meres MD on 12/30/2010 07:19:46 PM

## 2011-01-02 ENCOUNTER — Encounter: Payer: Self-pay | Admitting: Internal Medicine

## 2011-01-02 NOTE — Telephone Encounter (Signed)
LMTCB

## 2011-01-02 NOTE — Telephone Encounter (Signed)
Yes, pleas send order for portable o2

## 2011-01-05 NOTE — Telephone Encounter (Signed)
Called and spoke with pt.  Pt states [facility] has already come to her home and set her up on o2.  Nothing further needed.  Will sign off on this message.

## 2011-01-06 ENCOUNTER — Ambulatory Visit (INDEPENDENT_AMBULATORY_CARE_PROVIDER_SITE_OTHER): Payer: Medicare Other | Admitting: Internal Medicine

## 2011-01-06 ENCOUNTER — Encounter: Payer: Self-pay | Admitting: Internal Medicine

## 2011-01-06 VITALS — BP 100/60 | HR 82 | Temp 97.5°F | Ht <= 58 in | Wt 107.0 lb

## 2011-01-06 DIAGNOSIS — J449 Chronic obstructive pulmonary disease, unspecified: Secondary | ICD-10-CM | POA: Insufficient documentation

## 2011-01-06 DIAGNOSIS — F17200 Nicotine dependence, unspecified, uncomplicated: Secondary | ICD-10-CM

## 2011-01-06 DIAGNOSIS — Z720 Tobacco use: Secondary | ICD-10-CM

## 2011-01-06 DIAGNOSIS — J189 Pneumonia, unspecified organism: Secondary | ICD-10-CM

## 2011-01-06 HISTORY — DX: Chronic obstructive pulmonary disease, unspecified: J44.9

## 2011-01-06 MED ORDER — VARENICLINE TARTRATE 0.5 MG PO TABS: 0.5000 mg | ORAL_TABLET | Freq: Two times a day (BID) | ORAL | Status: DC

## 2011-01-06 NOTE — Assessment & Plan Note (Signed)
#  COPD: stable and improved  - continue spiriva; take sample, discount card and show technique  - I am referring you to pulmonary rehabilitatiin - my nurse will walk you now for oxygen levels on room air - return in 4 weeks - at that time will do spirometry and alpha 1 gene test

## 2011-01-06 NOTE — Progress Notes (Signed)
  Subjective:    Patient ID: [name], female    DOB: 03/01/1934, 77 y.o.   MRN: [id number]  HPI Admitted 12/16/2010 through 12/25/2010 for 1. Community-acquired pneumonia. 2. Chronic obstructive pulmonary disease exacerbation. 3. Coronary artery disease with a non-ST elevation myocardial     infarction. 4. Acute on chronic systolic congestive heart failure. 5. Tobacco abuse.  Now folllowing at pulmonary for COPD and tobacco abuse and pneumonia. She was discharged on spiriva and O2; lattter for first time. Feels well and back to baseline. Class 2 dyspnea; able to do ADLs without problem. No cough at baseline . Compliant with medications. Interested in pulmonary rehab. Interested in quitting smoking. Agreeable for chantix; no contraindications. Denies chest pain, fever,    Review of Systems  Constitutional: Negative for fever, chills and unexpected weight change.  HENT: Negative for ear pain, nosebleeds, congestion, sore throat, rhinorrhea, sneezing, trouble swallowing, dental problem, voice change, postnasal drip and sinus pressure.   Eyes: Negative for visual disturbance.  Respiratory: Positive for shortness of breath. Negative for cough and choking.   Cardiovascular: Negative for chest pain and leg swelling.  Gastrointestinal: Negative for vomiting, abdominal pain and diarrhea.  Genitourinary: Negative for difficulty urinating.  Musculoskeletal: Negative for arthralgias.  Skin: Negative for rash.  Neurological: Negative for tremors, syncope and headaches.  Hematological: Does not bruise/bleed easily.       Objective:   Physical Exam  Vitals reviewed. Constitutional: She is oriented to person, place, and time. She appears well-developed and well-nourished. No distress.       Thin, short  HENT:  Head: Normocephalic and atraumatic.  Right Ear: External ear normal.  Left Ear: External ear normal.  Mouth/Throat: Oropharynx is clear and moist. No oropharyngeal exudate.  Eyes:  Conjunctivae and EOM are normal. Pupils are equal, round, and reactive to light. Right eye exhibits no discharge. Left eye exhibits no discharge. No scleral icterus.  Neck: Normal range of motion. Neck supple. No JVD present. No tracheal deviation present. No thyromegaly present.  Cardiovascular: Normal rate, regular rhythm, normal heart sounds and intact distal pulses.  Exam reveals no gallop and no friction rub.   No murmur heard. Pulmonary/Chest: Effort normal. No respiratory distress. She has no wheezes. She has rales. She exhibits no tenderness.       Scar of old cabg +. Mild dry rales at right base. Overall air entry is diminshed. No wheeze  Abdominal: Soft. Bowel sounds are normal. She exhibits no distension and no mass. There is no tenderness. There is no rebound and no guarding.  Musculoskeletal: Normal range of motion. She exhibits no edema and no tenderness.  Lymphadenopathy:    She has no cervical adenopathy.  Neurological: She is alert and oriented to person, place, and time. She has normal reflexes. No cranial nerve deficit. She exhibits normal muscle tone. Coordination normal.  Skin: Skin is warm and dry. No rash noted. She is not diaphoretic. No erythema. No pallor.  Psychiatric: She has a normal mood and affect. Her behavior is normal. Judgment and thought content normal.          Assessment & Plan:

## 2011-01-06 NOTE — Assessment & Plan Note (Signed)
#  pneumonia  - glad you are better  - cxr upon return in 4 weeks

## 2011-01-06 NOTE — Patient Instructions (Signed)
#  COPD  - continue spiriva; take sample, discount card and show technique  - I am referring you to pulmonary rehabilitatiin - my nurse will walk you now for oxygen levels on room air - return in 4 weeks - at that time will do spirometry and alpha 1 gene test #pneumonia  - glad you are better  - cxr upon return in 4 weeks #Smoking  - please quit smoking   - I am writing for chantix to help quit smoking #followup  - 4 weeks

## 2011-01-06 NOTE — Assessment & Plan Note (Signed)
Counseled for 5 minutes. No contraindications for chantix. Will start chantix. Watch out for dreams and side effects. ROV 4 weeks

## 2011-01-07 ENCOUNTER — Ambulatory Visit (INDEPENDENT_AMBULATORY_CARE_PROVIDER_SITE_OTHER): Payer: Medicare Other | Admitting: Physician Assistant

## 2011-01-07 ENCOUNTER — Ambulatory Visit (INDEPENDENT_AMBULATORY_CARE_PROVIDER_SITE_OTHER): Payer: Medicare Other | Admitting: *Deleted

## 2011-01-07 ENCOUNTER — Encounter: Payer: Self-pay | Admitting: Physician Assistant

## 2011-01-07 VITALS — BP 112/70 | HR 64 | Ht <= 58 in | Wt 107.0 lb

## 2011-01-07 DIAGNOSIS — I714 Abdominal aortic aneurysm, without rupture, unspecified: Secondary | ICD-10-CM

## 2011-01-07 DIAGNOSIS — I2510 Atherosclerotic heart disease of native coronary artery without angina pectoris: Secondary | ICD-10-CM

## 2011-01-07 DIAGNOSIS — J4489 Other specified chronic obstructive pulmonary disease: Secondary | ICD-10-CM

## 2011-01-07 DIAGNOSIS — I5022 Chronic systolic (congestive) heart failure: Secondary | ICD-10-CM

## 2011-01-07 DIAGNOSIS — G459 Transient cerebral ischemic attack, unspecified: Secondary | ICD-10-CM

## 2011-01-07 DIAGNOSIS — I4891 Unspecified atrial fibrillation: Secondary | ICD-10-CM

## 2011-01-07 DIAGNOSIS — J449 Chronic obstructive pulmonary disease, unspecified: Secondary | ICD-10-CM

## 2011-01-07 LAB — BASIC METABOLIC PANEL
Chloride: 105 mEq/L (ref 96–112)
GFR: 58.09 mL/min — ABNORMAL LOW (ref >60.00)
Potassium: 4.4 mEq/L (ref 3.5–5.1)

## 2011-01-07 LAB — POCT INR: INR: 3.1

## 2011-01-07 MED ORDER — ALBUTEROL SULFATE HFA 108 (90 BASE) MCG/ACT IN AERS: 2.0000 | INHALATION_SPRAY | Freq: Four times a day (QID) | RESPIRATORY_TRACT | Status: DC | PRN

## 2011-01-07 MED ORDER — LOSARTAN POTASSIUM 25 MG PO TABS: 25.0000 mg | ORAL_TABLET | Freq: Every day | ORAL | Status: DC

## 2011-01-07 NOTE — Assessment & Plan Note (Signed)
Stable without angina.  As noted, her cardiac catheterization demonstrated stable anatomy.  Continue aspirin.  She is no longer on Plavix secondary to concomitant Coumadin therapy.

## 2011-01-07 NOTE — Assessment & Plan Note (Signed)
She is due for follow up.  I will arrange an abdominal ultrasound.

## 2011-01-07 NOTE — Patient Instructions (Addendum)
Your physician has requested that you have an abdominal aorta duplex 441.4 (AAA). During this test, an ultrasound is used to evaluate the aorta. Allow 30 minutes for this exam. Do not eat after midnight the day before and avoid carbonated beverages  Your physician recommends that you return for lab work in: TODAY BMET 427.31  A PRESCRIPTION HAS BEEN SENT IN FOR YOUR [facility]   PLEASE CALL [phone] AND ASK FOR SCOTT WEAVER, PA-C OR Kastin Cerda, CMA TO VERIFY IF YOU ARE TAKING DIGOXIN OR LOSARTAN.   Your physician recommends that you schedule a follow-up appointment in: 2 MONTH WITH DR. HOCHREIN AS PER SCOTT WEAVER, PA-C  START LOSARTAN 25 MG 1 TAB DAILY, REPEAT BMET 01/14/11

## 2011-01-07 NOTE — Assessment & Plan Note (Signed)
Follow up with pulmonology.  She will be given a refill on her albuterol.

## 2011-01-07 NOTE — Assessment & Plan Note (Signed)
Volume is stable.  Continue current diuretics.  Check a basic metabolic panel today.  She is on a fairly good medical regimen.  However, losartan is not listed on her medication list.  We will have her check this when she gets home.  If she is not taking this, I will have her start 25 mg a day with a repeat basic metabolic panel in one week.

## 2011-01-07 NOTE — Assessment & Plan Note (Signed)
Maintaining NSR.  Continue coumadin with high stroke risk.  She will let us know if she is taking digoxin or not.

## 2011-01-07 NOTE — Progress Notes (Signed)
History of Present Illness: Primary Cardiologist:  Dr. Rollene Rotunda  [name] is a 77 y.o. female With a history of CAD, status post CABG in 1995, ischemic cardiomyopathy, COPD, AAA who was recently admitted to [facility] 4/17-4/26 with community-acquired pneumonia/COPD exacerbation.  She was treated with antibiotics and seen by pulmonology.  Her ACE inhibitor was changed to an ARB and her medications were changed to includes Spiriva.  She developed acute on chronic systolic heart failure and was diuresed.  She did rule in for a non-ST elevation myocardial infarction with a peak troponin of 9.32 and a peak CK-MB of 126.8.  She was seen by cardiology.  Cardiac catheterization was recommended.  Her vein graft to the obtuse marginal was patent.  Her LIMA graft to the LAD demonstrated slow flow and her vein graft to the diagonal was occluded.  She had 80-90% stenosis in the obtuse marginal after touchdown.  Stents in the RCA were both patent.  Her anatomy was not felt to have significantly changed since prior catheterization and medical therapy was continued.  It was thought that her NSTEMI was caused by her COPD exacerbation and congestive heart failure.  Prior to discharge, she developed atrial fibrillation.  Her Plavix was discontinued and she was placed on Coumadin and digoxin was added to her medical regimen.  Cardizem had to be discontinued due to low blood pressure.  She returns for follow up.  Overall, she is doing better.  She denies significant shortness of breath.  She denies chest pain.  She denies orthopnea, PND or edema.  She denies syncope.  She does not have digoxin on her medication list.  She had a recent digoxin level that was low at 0.3.  Also, she is supposed to be on losartan 25 mg a day.  She will need to check her medications at home and let us know her exact list.  Past Medical History  Diagnosis Date  . Coronary artery disease     a.  s/p CABG 1996 in [facility];  b.  cath 12/22/10: oLM 90-95%, LAD and CFX occluded, OM 80-90% after graft, pRCA stent 50%, mRCA stent ok, S-OM ok with 40-50%, L-LAD with slow flow, S-Dx occluded;  no changes - med Tx continued  . Ischemic cardiomyopathy     a. EF approx 40% echo 2009;  b. echo 4/12: EF 35-40%, mild LVH, mod MR, mild ot mod AS with mean 19 mmHg, mild AI, LAE  . Non-sustained ventricular tachycardia   . Renal artery stenosis   . Carotid stenosis     carotid ultrasoun 0-39% R stenosis and 40-59% L stenosis;  dopplers 4/11: 0-39% bilat  . AAA (abdominal aortic aneurysm)     u/s 10/11: 4.6x4.7 cm  . Common iliac aneurysm     Left  2.3 cm  . MRSA (methicillin resistant Staphylococcus aureus)   . Vulvar cellulitis   . TIA (transient ischemic attack)   . Hyperlipidemia   . Hypertension   . Tobacco user   . COPD (chronic obstructive pulmonary disease)     Current Outpatient Prescriptions  Medication Sig Dispense Refill  . aspirin 81 MG tablet Take 81 mg by mouth daily.        . bisoprolol (ZEBETA) 10 MG tablet Take 10 mg by mouth daily.        . cloNIDine (CATAPRES) 0.1 MG tablet Take 0.1 mg by mouth 2 (two) times daily.        . ferrous fumarate (  FERRETTS) 325 (106 FE) MG TABS Take 325 mg by mouth 3 (three) times daily.        . furosemide (LASIX) 80 MG tablet Take 80 mg by mouth daily.       . isosorbide mononitrate (IMDUR) 30 MG 24 hr tablet Take 30 mg by mouth daily.        . multivitamin (THERAGRAN) per tablet Take 1 tablet by mouth 2 (two) times daily.        . nitroGLYCERIN (NITROSTAT) 0.4 MG SL tablet Place 0.4 mg under the tongue every 5 (five) minutes as needed.        . rosuvastatin (CRESTOR) 40 MG tablet Take 40 mg by mouth daily.        Marland Kitchen tiotropium (SPIRIVA) 18 MCG inhalation capsule Place 18 mcg into inhaler and inhale daily.        . varenicline (CHANTIX) 0.5 MG tablet Take 1 tablet (0.5 mg total) by mouth 2 (two) times daily. First one week take only one tablet daily. Take tablets after meals  and drink lot of water  60 tablet  2  . albuterol (PROVENTIL HFA) 108 (90 BASE) MCG/ACT inhaler Inhale 2 puffs into the lungs every 6 (six) hours as needed.        . cetirizine (ZYRTEC) 10 MG tablet Take 10 mg by mouth daily.        Marland Kitchen ezetimibe (ZETIA) 10 MG tablet Take 10 mg by mouth daily.        Marland Kitchen DISCONTD: enalapril (VASOTEC) 10 MG tablet Take 10 mg by mouth 2 (two) times daily.          Allergies  Allergen Reactions  . Codeine   . Sulfonamide Derivatives     Vital Signs: BP 112/70  Pulse 64  Ht 4\' 10"  (1.473 m)  Wt 107 lb (48.535 kg)  BMI 22.36 kg/m2  PHYSICAL EXAM: Well nourished, well developed, in no acute distress HEENT: normal Neck: no JVD Cardiac:  normal S1, S2; RRR; 2/6 harsh crescendo decrescendo systolic murmur heard best at the RUSB Lungs:  Decreased breath sounds bilaterally, no wheezing, rhonchi or rales Abd: soft, nontender, no hepatomegaly Ext: no edema; RFA site without hematoma or bruit Skin: warm and dry Neuro:  CNs 2-12 intact, no focal abnormalities noted  EKG:  Sinus rhythm, heart rate 88, left axis deviation, poor R-wave progression, PVC, nonspecific ST-T wave changes  ASSESSMENT AND PLAN:

## 2011-01-07 NOTE — Assessment & Plan Note (Signed)
She is on maximum dose Crestor.  LDL was 105 in the hospital.  LFTs were normal.

## 2011-01-10 NOTE — Discharge Summary (Signed)
[name], [name]               ACCOUNT NO.:  [id number]  MEDICAL RECORD NO.:  [id number]           PATIENT TYPE:  I  LOCATION:  4743                         FACILITY:  [facility]  PHYSICIAN:  Jeoffrey Massed, MD    DATE OF BIRTH:  Jul 16, 1934  DATE OF ADMISSION:  12/16/2010 DATE OF DISCHARGE:  12/25/2010                              DISCHARGE SUMMARY   ADDENDUM:  to discharge summary dated 12/24/10  PRIMARY CARE PHYSICIAN:  Dario Guardian, MD  CARDIOLOGIST:  Rollene Rotunda, MD, [facility]  PULMONOLOGIST:  Kalman Shan, MD  Originally, the patient was to be discharged on December 24, 2010.  Shortly before discharge, she developed atrial fibrillation with rates up into the 120s and 140s.  She was seen by [facility] who diagnosed her with paroxysmal atrial fibrillation, placed her on Coumadin therapy, discontinued her Plavix.  Shortly thereafter her blood pressure dropped to systolic in the low 80s.  Her diltiazem was discontinued and she was placed on digoxin.  Today, December 25, 2010, the patient appears well. She questions whether or not her atrial fibrillation could have been caused by Spiriva, a new medication she started on the 24th.  She was reassured that the doctors felt that was not the Spiriva that caused her atrial fibrillation.  Today, her rate has been steady and in the 70s. Her blood pressure is good and she will be discharged to home feeling much improved.  PHYSICAL EXAMINATION:  GENERAL:  At the time of discharge, the patient is alert and oriented in no apparent distress.  Her energy level is good.  She is eating lunch. VITAL SIGNS:  Temperature is 98.0, pulse 68, respirations 18, blood pressure 125/80.  Her O2 saturation is 96% on 2 L. HEENT:  Her head is atraumatic and normocephalic.  Eyes are anicteric. Pupils are equal, round, and reactive to light.  Nose shows no nasal discharge, exterior lesions. LUNGS:  Clear to auscultation without increased work of  breathing. HEART:  Regular rate and rhythm without rubs or gallops.  She has a slight systolic murmur. ABDOMEN:  Soft, nontender, nondistended with good bowel sounds. EXTREMITIES:  No clubbing, cyanosis, or edema. SKIN:  Intact without lesions or rash. PSYCHIATRIC:  The patient is alert and oriented.  Demeanor is very pleasant, cooperative.  Grooming is excellent.  LABORATORY DATA:  Today CBC shows a Anger count of 15.4, hemoglobin 12.8, hematocrit 39.7, platelets 312.  INR is 1.06.  UPDATED DISCHARGE MEDICATIONS: 1. Aspirin 81 mg 1 tablet daily. 2. Coumadin 5 mg 1 tablet daily to be directed by the [facility]. 3. Digoxin 0.125 mg, start taking on December 26, 2010, one tablet by     mouth daily. 4. Losartan 25 mg 1 tablet by mouth daily. 5. Nicotine 21 mg transdermal patch, one patch every 24 hours. 6. Prednisone.  She will be placed on a prednisone taper with 10 mg     tablets, 4 tablets for 2 days, 3 tablets for 2 days, 2 tablets for     2 days, then 1 tablet for 2 days, and then stop. 7.  Tiotropium bromide 18 mcg inhaler inhaled once daily. 8. Furosemide 80 mg 1 tablet by mouth daily. 9. Bisoprolol 10 mg 1 tablet by mouth daily. 10.Crestor 40 mg 1 tablet by mouth daily. 11.Isosorbide mononitrate 30 mg 1 tablet by mouth daily. 12.Nitroglycerin SL 0.4 mg for chest pain every 5 minutes as needed up     to three doses.  Stop taking the following medications:  Enalapril 10 mg 1 tablet by mouth twice daily.  DISCHARGE INSTRUCTIONS:  As follows: 1. She is to increase activity slowly.  She will go home with home     health. 2. Diet.  She is to be on a low sodium heart healthy diet with     attention to be paid to Coumadin restrictions.  These will be     reviewed with her prior to discharge. 3. The patient has been told to stop smoking and she has assured [place]     she will do so.  FOLLOWUP APPOINTMENTS: 1. Dr. Merri Brunette, PCP on Jan 06, 2011, at 12:15. 2. Dr.  Kalman Shan at [facility], Jan 06, 2011, at 3     p.m. 3. Dr. Rollene Rotunda, [facility], Jan 07, 2011, at 11 a.m.     Also on April 301 2012, she has an appointment at the [facility], this is to check both her digoxin level     and her INR.  INSTRUCTIONS:  Stop any activity that causes pain, shortness of breath, dizziness, sweating, or excessive weakness.  Call [facility] with any of these symptoms prior to your Jan 06, 2011, appointment or return to the emergency department.     Stephani Police, PA   ______________________________ Jeoffrey Massed, MD    MLY/MEDQ  D:  12/25/2010  T:  12/26/2010  Job:  [id number]  cc:   Dario Guardian, M.D. Rollene Rotunda, MD, [facility] Kalman Shan, MD  Electronically Signed by Algis Downs PA on 01/02/2011 11:38:48 AM Electronically Signed by Jeoffrey Massed  on 01/10/2011 12:28:22 PM

## 2011-01-13 NOTE — Assessment & Plan Note (Signed)
OFFICE VISIT   [name], [name]  DOB:  1934/08/03                                       01/16/2010  EAVWU#:[id number]   I saw [name] in the office today for continued followup of her  abdominal aortic aneurysm.  I had last seen her on Jan 08, 2009 at which  time by ultrasound the aneurysm measured 4.7 cm in maximum diameter and  the left common iliac artery measured 2.3 cm in maximum diameter.  She  was reportedly getting 51-month follow-up ultrasounds at the [place]  office and we asked her to continue these.  I have been seeing her on a  yearly basis.  Since I saw her last, she has had no history of abdominal  pain or back pain.   She does have a history of hypertension and hypercholesterolemia both of  which are stable on her current medications and are followed by Dr.  Merri Brunette and Dr. Antoine Poche.  She has had a history of previous  myocardial infarction in 2005 and also has a history congestive heart  failure but has had no recent cardiac problems.  She underwent coronary  revascularization in 1996.  She also had previous cardiac stents.  In  addition, she has a history of COPD.   SOCIAL HISTORY:  She is widowed.  She has 3 children.  She is continues  to smoke a pack per day of cigarettes and has been smoking since she was  77 years old.   REVIEW OF SYSTEMS:  CARDIOVASCULAR:  She has had no chest pain, chest  pressure, palpitations or arrhythmias.  She does admit to dyspnea on  exertion.  She had no claudication, rest pain or nonhealing ulcers.  She  had no history of stroke or TIAs.  She has had no history of DVT or  phlebitis.  PULMONARY:  She does have occasional productive cough.  She has had no  recent asthma or wheezing.  She does not use home O2.   PHYSICAL EXAMINATION:  On physical examination, this is a pleasant 77-  year-old woman who appears her stated age.  Her heart rate is 93.  Blood pressure 129/73, saturation 95%.  HEENT:   Unremarkable.  LUNGS:  Clear bilaterally to auscultation without rales or wheezing.  CARDIOVASCULAR:  I do not detect any carotid bruits.  She has a regular  rate and rhythm.  She has palpable femoral pulses and palpable pedal  pulses.  She has no significant lower extremity swelling.  ABDOMEN:  Soft and nontender.  Aneurysm is palpable and nontender.  She  has normal pitched bowel sounds.  NEUROLOGIC:  She has no focal weakness or paresthesias.   I have reviewed her ultrasound scan which was done at the [facility] office  and this shows that the maximum diameter of her aneurysm is 4.7 cm.  In  comparing this to her previous study, there has been no change.  The  left common iliac artery measures 2.5 cm in maximum diameter which also  has not changed significantly compared to 2.3 cm a year ago.   I have recommended that she continues her 6 month ultrasounds to follow  her aneurysm at the [place] office.  I have explained that in a normal  risk patient, we would typically consider elective repair at 5.5 cm.  We  have also  again discussed the importance of tobacco cessation as this is  one risk factors which increases her risk of aneurysm enlargement and  rupture.  I plan on seeing her back in 1 year.  She knows to call sooner  if she has problems.     Di Kindle. Edilia Bo, M.D.  Electronically Signed   CSD/MEDQ  D:  01/16/2010  T:  01/17/2010  Job:  [id number]   cc:   Rollene Rotunda, MD, [facility]

## 2011-01-13 NOTE — Assessment & Plan Note (Signed)
OFFICE VISIT   [name], [name]  DOB:  12/03/1933                                       01/08/2009  [id number]#:[id number]   I saw the patient in the office today for continued followup of her  abdominal aortic aneurysm and left common iliac artery aneurysm.  I had  last seen her in November of 2008 at which time she had an infrarenal  abdominal aortic aneurysm which measured 3.9 cm in maximum diameter.  The aneurysm was actually juxtarenal.  In addition, she had an aneurysm  of her left common iliac artery measuring 2.4 cm in maximum diameter.  At that time I explained we generally would not consider elective repair  of the aneurysm unless it reached 5.5 cm in maximum diameter and she was  scheduled for a 6 month followup visit but was then lost to followup.  She recently had an ultrasound at the [facility] office which shows the  maximum diameter of her aneurysm is 4.7 cm.  The left common iliac  artery measures 2.3 cm in maximum diameter.  The right common iliac is  normal in size.  She was sent for continued vascular followup.  Of note,  she has had no abdominal or back pain.   SOCIAL HISTORY:  She had smoked two packs of cigarettes per day for many  years but has cut back to a pack per day.   REVIEW OF SYSTEMS:  She has had no recent chest pain, chest pressure,  palpitations or arrhythmias.  She does admit to some dyspnea on  exertion.  Review of systems is otherwise unremarkable and is documented  on the medical history form in her chart.   PHYSICAL EXAMINATION:  This is a pleasant 77 year old woman who appears  her stated age.  Her blood pressure is 122/68, heart rate is 99.  I do  not detect any carotid bruits.  Lungs are clear bilaterally to  auscultation.  On cardiac exam she has a regular rate and rhythm.  Her  aneurysm is palpable and nontender.  She has palpable femoral pulses and  warm, well-perfused feet without evidence of atheroembolic  disease.   Her ultrasound shows again the maximum diameter of her aneurysm is 4.7  cm and the maximum left common iliac vein measures 2.3 cm.   I again explained we would not consider elective repair of the aneurysm  unless it reached 5.5 cm in maximum diameter.  This is based on American  Heart Association recommendations for a normal risk patient.  She would  prefer to continue her followups at the [place] office.  I would  recommend a followup ultrasound in 6 months.  If the aneurysm shows  evidence of continued enlargement then perhaps subsequent followup study  should be a CT scan to further assess the morphology of the aneurysm.  I  plan on seeing her back in one year so I do not lose her to followup.  We did discuss the importance of tobacco cessation and she understands  that this is one factor which does increase the risk of her aneurysm  enlarging.  Her blood pressure appears to be well-controlled.   Di Kindle. Edilia Bo, M.D.  Electronically Signed   CSD/MEDQ  D:  01/08/2009  T:  01/09/2009  Job:  [id number]   cc:   Rollene Rotunda, MD, [facility]

## 2011-01-13 NOTE — Procedures (Signed)
DUPLEX ULTRASOUND OF ABDOMINAL AORTA   INDICATION:  Follow up abdominal aortic aneurysm.   HISTORY:  Diabetes:  No.  Cardiac:  CABG in 1996 at [facility].  Patient states  that she has also had several cardiac stents put in by Dr. Samule Ohm.  Hypertension:  Yes.  Smoking:  Quit in July, 2008 after smoking two packs per day for 30  years.  Connective Tissue Disorder:  Family History:  No.  Previous Surgery:  Carotid endarterectomy in 1998.   DUPLEX EXAM:         AP (cm)                   TRANSVERSE (cm)  Proximal             2.39 Cm                   3.35 cm  Mid                  3.78 cm                   3.88 cm  Distal               3.10 cm                   2.86 cm  Right Iliac          1.23 cm                   1.27 cm  Left Iliac           1.92 cm                   2.37 cm   PREVIOUS:  Date: 05/07/2006 at [facility] vascular lab  AP:  4.1  TRANSVERSE:  4.2   IMPRESSION:  Stable measurements of known abdominal aortic aneurysm and  left common iliac artery aneurysm.   ___________________________________________  Di Kindle. Edilia Bo, M.D.   DP/MEDQ  D:  07/19/2007  T:  07/19/2007  Job:  [id number]

## 2011-01-13 NOTE — Assessment & Plan Note (Signed)
[facility]                            CARDIOLOGY OFFICE NOTE   NAME:[name], [name] SCHROYER                      MRN:          [id number]  DATE:08/27/2008                            DOB:          12/23/1933    PRIMARY CARE PHYSICIAN:  [name] Guardian, MD   REASON FOR PRESENTATION:  Evaluate the patient with coronary artery  disease and ischemic cardiomyopathy.   HISTORY OF PRESENT ILLNESS:  The patient presents for followup of the  above.  I last saw her on the 14th.  She had increasing shortness of  breath and edema and had been treated successfully with diuretics.  We  checked a blood work that day and found her BUN, creatinine, and  potassium were fine.  She has remained on this higher dose of 80 mg of  Lasix twice a day.  She has done well with this.  She has had no new  shortness of breath.  She has not had any PND or orthopnea.  She has had  no palpitation, presyncope, or syncope.  She is not having any chest  discomfort, neck or arm discomfort.   I did order an echocardiogram which demonstrated her EF to be 35%-40%  which was about what it has been maybe slightly higher than the  previous.  She did not have any significant mitral regurgitation, this  was mild.  She did have some mild-to-moderate aortic stenosis which was  unchanged.   Unfortunately, she continues to smoke cigarettes.   PAST MEDICAL HISTORY:  1. Coronary artery disease (status post CABG in 1996.  The most recent      cath in 2004 demonstrated the left main had 80% ostial stenosis.      The LAD was occluded.  The first diagonal had 70% stenosis.      Circumflex was occluded proximally.  A marginal branch had 90%      stenosis and was small.  The right coronary artery is moderate size      and dominant.  There was a patent stent with 70% ostial stenosis      and 90% stenosis in the mid vessel and 90% acute marginal stenosis.      She had an occluded saphenous vein graft to the  diagonal.  The      saphenous vein graft to the obtuse marginal was patent.  LIMA to      the LAD was patent).  2. Cardiomyopathy (EF approximately 35%).  3. Nonsustained ventricular tachycardia.  4. Renal artery stenosis.  5. Peripheral vascular disease (status post carotid endarterectomy in      1998 on the right.  The most recent carotid ultrasound demonstrated      a 0-39% right stenosis and 40%-59% left stenosis).  6. Abdominal aortic aneurysm (4.22 cm).  7. Left common iliac aneurysm (2.3 cm).  8. Laparoscopic cholecystectomy.  9. MRSA.  10.Vulvar cellulitis.  11.TIAs.  12.Hyperlipidemia.  13.Hypertension.  14.Tobacco abuse.   ALLERGIES:  SULFA and CODEINE.   MEDICATIONS:  1. Furosemide 80 mg b.i.d.  2. Isosorbide 30 mg daily.  3. Bisoprolol  10 mg daily.  4. Zetia 10 mg daily.  5. Enalapril 10 mg daily.  6. Clonidine 0.1 mg b.i.d.  7. Aspirin 81 mg daily.  8. Iron 325 mg t.i.d.  9. Crestor 10 mg daily.  10.Lexapro 10 mg daily.   REVIEW OF SYSTEMS:  As stated in the HPI, otherwise negative for other  systems.   PHYSICAL EXAMINATION:  GENERAL:  The patient is pleasant and in no  distress.  VITAL SIGNS:  Blood pressure 143/78, heart rate 73 and regular, weight  109 pounds, and body mass index 14.  HEENT:  Eyes are unremarkable, pupils equal, round, and reactive, fundi  not visualized, oral mucosa unremarkable.  NECK:  No jugular vein distention 45 degrees, carotid upstroke brisk and  symmetric, no bruits, right carotid endarterectomy scar, no thyromegaly.  LYMPHATICS:  No cervical, axillary, inguinal adenopathy.  LUNGS:  Clear to auscultation bilaterally.  BACK:  No costovertebral angle tenderness.  CHEST:  Well-healed sternotomy scar.  HEART:  PMI not displaced or sustained, S1 and S2 within normal.  No S3,  no S4, no clicks, no rubs, 2/6 apical systolic murmur radiating slightly  out the aortic outflow tract, no diastolic murmurs.  ABDOMEN:  Flat, positive  bowel sounds, normal frequency and pitch, no  bruits, rebound, guarding, or midline pulsatile mass.  No hepatomegaly, no splenomegaly.  SKIN:  No rashes, no nodules.  EXTREMITIES:  2+ peripheral pulses, 2+ femorals, 2+ popliteal on right,  1+ popliteal on the left, 2+ dorsalis pedis and posterior tibialis on  the right, 1+ pulse, 1+ posterior tibialis on the left, no cyanosis, no  clubbing, no edema.  NEUROLOGIC:  Oriented to person, place, and time, cranial nerves II  through XII grossly intact, motor grossly intact.   ASSESSMENT AND PLAN:  1. Cardiomyopathy.  She seems to be doing better on the higher dose of      Lasix and I will continue this.  She will get a BMET.  She is      otherwise on good meds.  She has had no change in her valvular      disease.  We will continue with aggressive medical management.  2. Coronary artery disease.  It has been several years since she has      had any kind of evaluation of this.  I think there is a low      possibility that ischemia played a role in her recent acute      decompensation.  However, an adenosine Cardiolite is warranted.  3. Nonsustained ventricular tachycardia.  I am going to research this      to see when this was recorded.  With an ejection fraction of 35%,      with coronary artery disease, she should be considered for an      implantable cardioverter-defibrillator and we began this      conversation today.  I will discuss this again at the next visit.  4. Peripheral vascular disease/abdominal aortic aneurysm.  She is to      call Dr. Adele Dan office to see when this will be followed up.  5. Tobacco.  We talked about the need to stop smoking (greater than 3      minutes).  I gave her prescription for Chantix which she took in      the past.  6. Dyslipidemia per her primary care doctor.  A goal should be an LDL      less than 70 and HDL greater  than 50.  7. Followup.  I would like to see her back in 1 month or sooner if       needed.     Rollene Rotunda, MD, [facility]  Electronically Signed    JH/MedQ  DD: 08/27/2008  DT: 08/27/2008  Job #: [id number]   cc:   [name], M.D.

## 2011-01-13 NOTE — Assessment & Plan Note (Signed)
[facility]                            CARDIOLOGY OFFICE NOTE   NAME:[name], [name]                      MRN:          [id number]  DATE:08/13/2008                            DOB:          [birth year]    PRIMARY CARE PHYSICIAN:  Dario Guardian, MD   REASON FOR REFERRAL:  Evaluate the patient with heart failure.   HISTORY OF PRESENT ILLNESS:  The patient has a history of ischemic  cardiomyopathy.  She started complaining of cough around Thanksgiving.  She said this was persistent and nonproductive.  She was having a little  more dyspnea with exertion though this was not the predominant  complaint.  She did describe at least 1 or 2 nights of orthopnea and  PND.  She saw  Feliz Beam.  At this recent appointment, she  was found to have a BMP level of 1420.  Over the weekend, she was told  to double her Lasix from 80 once a day to twice a day.  She says her  breathing is actually better.  She has not had any PND or orthopnea over  the weekend.  She has had little lower extremity swelling that is more  than usual but this she thinks is mild.  She has, otherwise, not noticed  any significant weight gain.  She has had no chest pressure, neck, or  arm discomfort.  She has not had any palpitations, presyncope, or  syncope.  She has not changed her diet or salt intake recently.   PAST MEDICAL HISTORY:  1. Coronary artery disease (status post CABG in 1996 at [facility].      Most recent cath in 2004 demonstrated left main had 80% ostial      stenosis, the LAD was occluded, the first diagonal had 70%      stenosis, the circumflex was occluded proximally, a marginal branch      was small with 90% stenosis, the right coronary artery was moderate      size and dominant.  She had a patent stent but 70% ostial stenosis,      90% stenosis in the mid vessel and 90% acute marginal stenosis.      She had an occluded saphenous vein graft to the diagonal.  Saphenous vein graft to the obtuse marginal was patent.  LIMA to      the LAD was patent.).  2. Cardiomyopathy (EF 35%).  3. Nonsustained ventricular tachycardia.  4. Renal artery stenosis.  5. Peripheral vascular disease (status post carotid endarterectomy in      1998 in the right, the most recent carotid ultrasound demonstrated      a 0-39% right stenosis and 40-59% left stenosis).  6. Abdominal aortic aneurysm (4.2 cm), left common iliac aneurysm (2.3      cm).  7. Laparoscopic cholecystectomy.  8. MRSA.  9. Vulvar cellulitis.  10.TIAs.  11.Hyperlipidemia.  12.Hypertension.  13.Tobacco abuse (She says she has now quit these last couple of      days).   ALLERGIES:  SULFA and Codeine.   MEDICATIONS:  1. Furosemide 80 mg  b.i.d.  2. Isosorbide 30 mg daily.  3. Bisoprolol 10 mg daily.  4. Zetia 10 mg daily.  5. Enalapril 10 mg daily.  6. Clonidine 0.1 mg b.i.d.  7. Aspirin 81 mg daily.  8. Iron 325 mg t.i.d.  9. Crestor 10 mg daily.  10.Lexapro 10 mg daily.   REVIEW OF SYSTEMS:  As stated in the HPI and otherwise negative for  other systems.   PHYSICAL EXAMINATION:  GENERAL:  The patient is in no distress.  VITAL SIGNS:  Blood pressure 111/75, heart rate 70 and regular.  HEENT:  Eyelids are unremarkable.  Pupils equal, round, and reactive to  light.  Fundi not visualized.  Oral mucosa unremarkable.  NECK:  No jugular venous distention, at 45 degrees.  Carotid upstroke  brisk and symmetric.  No bruits, no thyromegaly.  LYMPHATICS:  No cervical, axillary, or inguinal adenopathy.  LUNGS:  Clear to auscultation bilaterally.  BACK:  No costovertebral angle tenderness.  CHEST:  Well-healed sternotomy scar.  HEART:  PMI not displaced or sustained.  S1 and S2 within normal limits.  No S3, no S4, no clicks, no rubs, no murmurs.  ABDOMEN:  Flat, positive bowel sounds, normal in frequency and pitch.  No bruits, no rebound, no guarding, no midline pulsatile mass, no   hepatomegaly, no splenomegaly.  SKIN:  No rashes, no nodules.  EXTREMITIES:  A 2+ pulse throughout.  No edema, no cyanosis, no  clubbing.  NEURO:  Oriented to person, place, and time.  Cranial nerves II through  XII grossly intact.  Motor grossly intact.   EKG, sinus rhythm; rate 72; right axis deviation, I suspect lead  reversal on this EKG; poor anterior R-wave progression, old anteroseptal  infarct.   ASSESSMENT AND PLAN:  1. The patient's dyspnea is improved with diuretic.  She did have      elevated BNP.  At this point, I am going to start with a repeat      echocardiogram to see if her mild mitral regurgitation, mild aortic      stenosis, or moderately severe left ventricular dysfunction have      worsened.  For now, she remain on the furosemide 80 mg twice a day.      I am going to get a BMET today.  I will call her to tell her      whether she should continue this or change.  I am going to leave      the other meds as listed right now.  I will also have a low      threshold for stress perfusion imaging in the future, as it has      been quite a while.  I do plan on seeing her back in about 2 weeks      or sooner based on symptoms or the results of the echo.  2. Coronary artery disease as described above.  3. Dyslipidemia per primary care doctor.  The goal should be an LDL of      less than 70 and HDL of greater than 40.  4. Abdominal aortic aneurysm.  The patient is due to have this checked      again in May.  5. Carotid stenosis.  This is due also in the spring, but I will delay      this for another year as it has been stable.  6. Hypertension.  Blood pressure is well controlled.  Actually, a low      blood pressure  may preclude too much up titration of her      medications.  7. Followup.  Again, I am going to see her again in a couple of weeks      or sooner if needed.     Rollene Rotunda, MD, [facility]  Electronically Signed    JH/MedQ  DD: 08/13/2008  DT: 08/14/2008   Job #: [id number]   cc:   Ricci Barker, PA @ [facility]

## 2011-01-13 NOTE — Assessment & Plan Note (Signed)
OFFICE VISIT   [name], [name]  DOB:  1934/03/14                                       07/19/2007  JJOAC#:[id number]   I saw the patient in the office today for continued followup of her  abdominal aortic aneurysm.  I had originally seen her in consultation in  October of 2007.  She had a small aneurysm, which had been followed for  about 3 years.  Of note, this is a juxtarenal aneurysm, and when I saw  her initially in October of 2007, it was 4.2 cm in maximum diameter.  Her left common iliac artery aneurysm was 2.5 cm in maximum diameter.  She comes in for a routine 75-month followup study.  Since I saw her  last, she has undergone a cholecystectomy and has quit smoking.  She has  had no abdominal or back pain.  There has been no significant change in  her medical history.   REVIEW OF SYSTEMS:  CARDIAC:  She denies any chest pain, chest pressure,  palpitations, or arrhythmia.  VASCULAR:  She has had no claudication, rest pain, or non-healing  ulcers.  PULMONARY:  She has had no bronchitis, asthma, or wheezing recently.  She does have some chronic cough.   PHYSICAL EXAMINATION:  Blood pressure 161/84, heart rate is 74.  I do  not detect any carotid bruits.  There is no cervical lymphadenopathy.  Her lungs are clear bilaterally to auscultation.  On cardiac exam, she  has a regular rate and rhythm.  She has palpable femoral, popliteal, and  pedal pulses bilaterally.  Her aneurysm is palpable and nontender.  She  has normal-pitched bowel sounds.   Her ultrasound shows a maximum diameter of her aneurysm is 3.9 cm.  The  maximum diameter of her left iliac artery aneurysm is 2.4 cm.  These  measurements have not changed, and the aneurysm remains stable in size.  I will see her back in 6 months with a followup duplex scan.  She  understands we would generally not consider elective repair unless the  aneurysm reached 5.5 cm in maximum diameter.  Based on the  fact that her  aneurysm is juxtarenal, she is probably not a candidate for an  endovascular approach to her aneurysm.   Di Kindle. Edilia Bo, M.D.  Electronically Signed   CSD/MEDQ  D:  07/19/2007  T:  07/20/2007  Job:  [id number]

## 2011-01-13 NOTE — Assessment & Plan Note (Signed)
[facility]                            CARDIOLOGY OFFICE NOTE   NAME:[name], [name]                      MRN:          [id number]  DATE:04/12/2007                            DOB:          [birth year]    PRIMARY CARE PHYSICIAN:  Dario Guardian, M.D.   REASON FOR PRESENTATION:  Patient with coronary disease and ischemic  cardiomyopathy.   HISTORY OF PRESENT ILLNESS:  The patient is a 77 year old [name] female  previously followed by Dr. Samule Ohm.  She has an extensive cardiac history  as described below.  At the last visit with Dr. Samule Ohm she described an  episode of shortness of breath that she treated with increased dose of  diuretic.  She has since felt pretty well.  She said that she does not  have any acute shortness of breath currently.  She has not been having  any PND or orthopnea.  She has not been having any palpitations,  presyncope, or syncope.  She has had no chest or neck discomfort.  She  has no arm discomfort.  She denies any palpitations, presyncope, or  syncope.  She did have to switch her Statin for cost.  She apparently  was tried on simvastatin but is now being switched to Crestor,  apparently because the simvastatin did not allow her to reach target.  She remains off cigarettes per her report.   PAST MEDICAL HISTORY:  1. Coronary artery disease (CABG in 1996 in [place].  In 2001 she      had a stent to the right coronary artery and her EF was normal.  In      2004 she had congestive heart failure but her EF was 50%.  At that      time she had another catheterization with two further stents      placed in the proximal and mid RCA.  She did rule in for an infarct      at that time.  She had a non-Q-wave infarct in 2005 and was found      at that time to have a reduced ejection fraction but a nonstress      profusion study not evident for active ischemia.  The last      catheterization in 2004 demonstrated the left main to have  80%      osteal stenosis, the LAD was occluded, first diagonal had 70%      stenosis, the circumflex was occluded proximally, a marginal branch      was small with 90% stenosis, the right coronary artery was moderate-      sized and dominant.  She had a patent stent but a 70% osteal      lesion, 90% stenosis in the mid vessel and a 90% acute marginal      vessel.  There was an occluded saphenous vein graft to the      diagonal.  There was a patent saphenous vein graft to the first      obtuse marginal.  There was patent LIMA to the LAD.).  2. Ischemic cardiomyopathy (EF 35%).  3. Nonsustained ventricular tachycardia.  4. Renal artery stenosis.  5. Peripheral vascular disease (status post carotid endarterectomy in      1998 on the right.  The most recent carotid ultrasound demonstrated      0-39% right stenosis and 40-59% left stenosis).  6. Abdominal aortic aneurysm (4.2 cm followed by Dr. Cari Caraway).  7. Left common iliac 2.3 cm aneurysm, status post laparoscopic      cholecystectomy.  8. MRSA vulvar cellulitis.  9. TIAs.  10.Hyperlipidemia.  11.Hypertension.  12.Previous tobacco use.   ALLERGIES:  1. SULFA.  2. CODEINE.   MEDICATIONS:  1. Iron 325 mg t.i.d.  2. Aspirin 81 mg daily.  3. Isosorbide 30 mg daily.  4. Bisoprolol 10 mg daily.  5. Enalapril 10 mg b.i.d.  6. Furosemide 80 mg daily.  7. Clonidine 0.1 mg b.i.d.  8. Crestor (the patient is not clear on the dose, but it apparently      appears to be 10 mg daily).   REVIEW OF SYSTEMS:  As stated in the HPI, otherwise negative for other  systems.   PHYSICAL EXAMINATION:  The patient is in no acute distress.  Blood pressure 118/68, heart rate 70 and regular, weight 113 pounds,  body mass index 15.  HEENT:  Unremarkable.  Pupils equal, round, and reactive to light.  Fundi not visualized.  Oral mucosa unremarkable.  NECK:  No jugular venous distention.  Wave form within normal limits.  Carotid upstroke brisk and  symmetric.  Bilateral carotid bruits, right  greater than left.  No thyromegaly.  LYMPHATICS:  No cervical, axillary, or inguinal adenopathy,  LUNGS:  Clear to auscultation bilaterally.  BACK:  No costovertebral angle tenderness.  CHEST:  Unremarkable.  HEART:  PMI not displaced or sustained.  S1/S2 within normal limits.  No  S3, no S4, no clicks, no rubs, no murmurs.  ABDOMEN:  Flat, positive bowel sounds.  Normal in frequency and pitch.  No bruits, no rebound, no guarding, no midline pulsatile mass.  No  hepatomegaly, no splenomegaly.  SKIN:  No rashes, no nodules.  EXTREMITIES:  2+ pulses.  No cyanosis, no clubbing, no edema.  NEUROLOGIC:  Oriented to person, place, and time.  Cranial nerves II-XII  grossly intact.  Motor grossly intact throughout.   EKG:  Sinus rhythm, rate 75.  Left axis deviation, old inferior infarct,  left atrial enlargement, old anteroseptal infarct.  QT prolonged.  T-  wave inversions unchanged from previous.   ASSESSMENT/PLAN:  1. Coronary artery disease.  The patient has no ongoing symptoms.  She      will continue with aggressive secondary risk reduction.  No further      cardiovascular testing is suggested.  2. Ischemic cardiomyopathy.  The patient is on a target dose of      enalapril and bisoprolol.  She does not meet criteria for      Spironolactone.  She does not need further medication titration.      She will continue on the current regimen.  I will make sure that      she has her renal function checked and potassium.  3. Dyslipidemia per her primary care doctor.  The goal will be an LDL      less than 100 and HDL greater than 50.  4. Follow-up.  I would like to see her back in about six months or      sooner if needed.  Of note greater than 40 minutes was spent  reviewing this chart.     Rollene Rotunda, MD, [facility]  Electronically Signed    JH/MedQ  DD: 04/12/2007  DT: 04/14/2007  Job #: [id number]   cc:   Dario Guardian, M.D.
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
## 2011-01-13 NOTE — Assessment & Plan Note (Signed)
[place] [facility]                            CARDIOLOGY OFFICE NOTE   NAME:[name], [name]                      MRN:          [id number]  DATE:10/20/2007                            DOB:          [birth year]    PRIMARY:  Dr. Merri Brunette.   REASON PRESENTATION:  Evaluate the patient with coronary disease and  ischemic cardiomyopathy.   HISTORY OF PRESENT ILLNESS:  The patient is 77 year old [name]  with coronary disease as described below.  She presents for follow-up.  She had no new problems since I last saw her.  She fortunately started  smoking again about half pack per day.  She says she has felt pretty  well.  She is not having any acute shortness of breath.  She has not  been having any PND or orthopnea.  She is remaining relatively active  though she sprained her ankle recently and this is limiting her.  She is  not been having any chest discomfort, neck or arm discomfort.  Not  describing any palpitations, presyncope, syncope.  Of note, she was put  on Zetia.  She thought this was to replace Crestor and so she stopped  taking that, and has only been taking Zetia.  We clarified with the  primary care office and she understands that she is to be on both.   PAST MEDICAL HISTORY:  Coronary artery disease (status post CABG in 1996  in [place].  She has had caths and stenting since that time.  Please  see the details in the April 12, 2007 note), ischemic cardiomyopathy  (EF approximately 35%) nonsustained ventricle tachycardia, renal artery  stenosis, peripheral vascular disease (status post carotid  endarterectomy 1998 on the right.  Most recent carotid ultrasound  demonstrated a 0-39% right stenosis and 40-59% left stenosis), abdominal  aortic aneurysm (4.2 cm), left common iliac 2.3 cm aneurysm (followed by  Dr. Edilia Bo), laparoscopic cholecystectomy, MRSA vulvar cellulitis,  TIAs, hyperlipidemia, hypertension, previous tobacco use.   ALLERGIES:  SULFA.   MEDICATIONS:  Iron 325 mg t.i.d. aspirin 81 mg daily, isosorbide 30 mg  daily, bisoprolol 10 mg daily, enalapril 10 mg b.i.d., furosemide 80 mg  daily, clonidine 0.1 mg b.i.d., Zyrtec 10 mg daily, Zetia 10 mg daily.   REVIEW OF SYSTEMS:  As stated in the HPI, otherwise negative for other  systems.   PHYSICAL EXAMINATION:  The patient is in no distress.  Blood pressure 118/69, heart rate 75 regular, weight 114 pounds, body  mass index 15.  HEENT:  Eyelids unremarkable.  Pupils are equal, round, and reactive to  light and accommodation.  Fundi are not visualized.  Oral mucosa  unremarkable.  NECK:  No jugular venous distension, wave form within normal limits,  carotid upstroke brisk and symmetric, bilateral carotid bruits, right  greater than left, no thyromegaly.  LYMPHATICS:  No adenopathy.  LUNGS:  Clear to auscultation bilaterally.  BACK:  No costovertebral angle tenderness.  CHEST:  Unremarkable.  HEART:  PMI not displaced or sustained, S1 and S2 within normal limits,  no S3, no S4, no clicks, rubs, murmurs.  ABDOMEN:  Flat, positive bowel sounds, normal in frequency and pitch, no  bruits, rebound, guarding.  No midline pulsatile masses, hepatomegaly,  splenomegaly.  SKIN:  No rashes, no nodules.  EXTREMITIES:  With 2+ pulses throughout, no edema, cyanosis, clubbing.  NEURO:  Grossly intact.   EKG sinus rhythm, rate 75, left axis deviation, left atrial enlargement,  lateral T-wave inversions, no change from previous.   ASSESSMENT/PLAN:  1. Coronary disease.  The patient having ongoing symptoms.  No further      cardiovascular sting is suggested.  2. Ischemic cardiomyopathy.  She is on reasonable dose of medicine and      I will continue this regimen.  3. Dyslipidemia.  Checked with a primary care physician.  She was to      be taking Crestor and Zetia.  I have rewritten a prescription for      Crestor as she was not taking this.  We will arrange to  have lipids      and liver checked in 8 weeks in our office for her request.  4. Peripheral vascular disease.  The patient is to have carotid      Doppler and abdominal ultrasound which I will arrange for April.      She sees Dr. Edilia Bo about her iliac aneurysm.  5. Tobacco.  We discussed the need to stop smoking again (greater than      3 minutes).  Hopefully she can comply with this.  She stopped cold      [place] before.  She will try this again.  6. Follow-up.  I will see her again back in about 6 months or sooner      if needed.     Rollene Rotunda, MD, [facility]  Electronically Signed    JH/MedQ  DD: 10/20/2007  DT: 10/21/2007  Job #: [id number]   cc:   Dario Guardian, M.D.

## 2011-01-13 NOTE — Assessment & Plan Note (Signed)
[place] [facility]                            CARDIOLOGY OFFICE NOTE   NAME:[name], [name]                      MRN:          [id number]  DATE:09/18/2008                            DOB:          [birth year]    PRIMARY CARE PHYSICIAN:  Dario Guardian, MD   REASON FOR PRESENTATION:  Evaluate the patient with coronary artery  disease and ischemic cardiomyopathy.   HISTORY OF PRESENT ILLNESS:  The patient presents for followup.  At the  last visit, she was doing better after having been treated for probable  volume overload with an increased dose of Lasix.  I did repeat an  echocardiogram in December demonstrating her EF was 35-40% was perhaps  slightly better than previous.  She has some mild-to-moderate AS.  I  also sent her for a stress perfusion study, which demonstrated that her  EF was about 45%.  She had an apical infarct with mild peri-infarct  ischemia, though there were no high risk regions elsewhere.  We decided  to manage this medically.  I did prescribe Chantix.  She is down to  smoking half pack of cigarettes a day, which is much less than previous.   She states the cough that she was having back in December, is improved.  She is breathing much better.  She will get dyspneic walking a flight of  stairs (class II symptoms), however, this has improved.  She is not  having any resting shortness of breath.  She denies any PND or  orthopnea.  She is not having any palpitation, presyncope, or syncope.  She is not having any chest discomfort, neck or arm discomfort.   PAST MEDICAL HISTORY:  1. Coronary artery disease (status post CABG in 1996 in [place].      See the April 12, 2007, note for details of her most recent      catheterization).  2. Ischemic cardiomyopathy (EF approximately 40%).  3. Non-sustained ventricular tachycardia.  4. Renal artery stenosis.  5. Peripheral vascular disease (status post carotid endarterectomy in      1998 on  the right, carotid ultrasound 0-39% right stenosis and 40-      59% left stenosis).  6. Abdominal aortic aneurysm (4.2 cm).  7. Left common iliac aneurysm (2.3 cm).  8. Laparoscopic cholecystectomy.  9. MRSA.  10.Vulvar cellulitis.  11.TIAs.  12.Hyperlipidemia.  13.Hypertension.  14.Ongoing tobacco use.   ALLERGIES:  SULFA.   MEDICATIONS:  1. Furosemide 80 mg b.i.d.  2. Isosorbide 30 mg daily.  3. Bisoprolol 10 mg daily.  4. Zetia 10 mg daily.  5. Clonidine 0.1 mg b.i.d.  6. Aspirin 81 mg daily.  7. Iron 325 mg t.i.d.  8. Crestor 10 mg daily.  9. Lexapro 10 mg daily.  10.Chantix.  11.Enalapril 10 mg b.i.d.  12.Theragran.  13.Multivitamin.  14.Cetirizine 10 mg daily.  15.Zolpidem 10 mg daily.   REVIEW OF SYSTEMS:  As stated in the HPI, and otherwise negative for  other systems.   PHYSICAL EXAMINATION:  GENERAL:  The patient is pleasant and in no  distress.  VITAL SIGNS:  Blood pressure 120/73, heart rate 81 and regular, weight  109 pounds, and body mass index 14.  HEENT:  Eyelids unremarkable; pupils equal, round, and reactive to  light; fundi not visualized, oral mucosa unremarkable.  NECK:  No jugular venous distention at 45 degrees; carotid upstroke  brisk and symmetric; no bruits, no thyromegaly.  LYMPHATICS:  No cervical, axillary, or inguinal adenopathy.  LUNGS:  Clear to auscultation bilaterally.  BACK:  No costovertebral angle tenderness.  CHEST:  Well-healed sternotomy scar.  HEART:  PMI not displaced or sustained; S1 and S2 within normal limits;  no S3, no S4; no clicks, no rubs, no murmurs.  ABDOMEN:  Flat; positive  bowel sounds; normal in frequency and pitch; no bruits, no rebound, no  guarding; no midline pulsatile mass; no organomegaly.  SKIN:  No rashes.  No nodules.  EXTREMITIES:  Pulses 2+ throughout; no edema, no cyanosis, no clubbing.  NEURO:  Grossly intact.   ASSESSMENT AND PLAN:  1. Coronary artery disease.  The patient had stress perfusion  study      demonstrating old apical infarct.  I think this is a low risk scan      and she can be managed medically with risk reduction.  2. Palpitations, she is no longer having these.  We discussed an ICD.      However, with an EF of about 40%, there would be no primary risk      reduction class I or II A indication for an ICD.  We will continue      this conversation going forward.  3. Dyslipidemia.  She does have an LDL and I reviewed this that is      slightly above 100.  Her HDL is in the 40s.  Therefore, I am taking      the liberty of increasing her Crestor to 20 mg daily.  I think with      her ongoing tobacco, we should strive for an LDL less than 70 and      an HDL greater than 50.  4. Tobacco.  We discussed this again.  She is going to continue the      Chantix.  She knows complete cessation is the goal (greater than 3      minutes).  5. Peripheral vascular disease.  She is going to call Dr. Edilia Bo to      get followup as it is a about time to have her carotids and      abdominal aneurysm evaluated.  6. Cardiomyopathy as above.  7. Hypertension.  Blood pressure is well controlled and she will      continue the meds as listed.  8. Followup.  I will see her back in about 6 months or sooner if      needed.     Rollene Rotunda, MD, [facility]  Electronically Signed    JH/MedQ  DD: 09/18/2008  DT: 09/19/2008  Job #: [id number]   cc:   Dario Guardian, M.D.

## 2011-01-13 NOTE — Procedures (Signed)
DUPLEX ULTRASOUND OF ABDOMINAL AORTA   INDICATION:  Followup, abdominal aortic aneurysm.   HISTORY:  Diabetes:  No.  Cardiac:  CABG, stents.  Hypertension:  Yes.  Smoking:  Quit.  Connective Tissue Disorder:  Family History:  No.  Previous Surgery:  No AAA surgery.  Note:  Patient has had multiple TIAs and carotid is being followed at  [facility], per patient.   DUPLEX EXAM:         AP (cm)                   TRANSVERSE (cm)  Proximal             1.31 Cm                   1.33 cm  Mid                  4.0 cm                    4.24 cm  Distal               2.78 cm                   2.84 cm  Right Iliac          1.17 cm                   1.08 cm  Left Iliac           2.11 cm                   2.40 cm   PREVIOUS:  Date: 07/19/07  AP:  3.78  TRANSVERSE:  3.88   IMPRESSION:  1. Abdominal aortic aneurysm shows slight increase to 4.0 cm X 4.24      cm.  2. Left iliac artery aneurysm appears stable at 2.11 cm X 2.40 cm.   ___________________________________________  Di Kindle. Edilia Bo, M.D.      AS/MEDQ  D:  01/17/2008  T:  01/17/2008  Job:  [id number]     cc:   Rollene Rotunda, MD, [facility]

## 2011-01-14 ENCOUNTER — Other Ambulatory Visit: Payer: Self-pay | Admitting: Cardiology

## 2011-01-14 ENCOUNTER — Encounter: Payer: Medicare Other | Admitting: *Deleted

## 2011-01-14 ENCOUNTER — Other Ambulatory Visit: Payer: Medicare Other | Admitting: *Deleted

## 2011-01-14 DIAGNOSIS — I714 Abdominal aortic aneurysm, without rupture: Secondary | ICD-10-CM

## 2011-01-15 ENCOUNTER — Other Ambulatory Visit (INDEPENDENT_AMBULATORY_CARE_PROVIDER_SITE_OTHER): Payer: Medicare Other | Admitting: *Deleted

## 2011-01-15 ENCOUNTER — Encounter (INDEPENDENT_AMBULATORY_CARE_PROVIDER_SITE_OTHER): Payer: Medicare Other | Admitting: Cardiology

## 2011-01-15 ENCOUNTER — Ambulatory Visit (INDEPENDENT_AMBULATORY_CARE_PROVIDER_SITE_OTHER): Payer: Medicare Other | Admitting: *Deleted

## 2011-01-15 DIAGNOSIS — I4891 Unspecified atrial fibrillation: Secondary | ICD-10-CM

## 2011-01-15 DIAGNOSIS — G459 Transient cerebral ischemic attack, unspecified: Secondary | ICD-10-CM

## 2011-01-15 DIAGNOSIS — I2510 Atherosclerotic heart disease of native coronary artery without angina pectoris: Secondary | ICD-10-CM

## 2011-01-15 DIAGNOSIS — I5022 Chronic systolic (congestive) heart failure: Secondary | ICD-10-CM

## 2011-01-15 DIAGNOSIS — I714 Abdominal aortic aneurysm, without rupture: Secondary | ICD-10-CM

## 2011-01-15 DIAGNOSIS — J449 Chronic obstructive pulmonary disease, unspecified: Secondary | ICD-10-CM

## 2011-01-15 LAB — BASIC METABOLIC PANEL
CO2: 35 mEq/L — ABNORMAL HIGH (ref 19–32)
Calcium: 9.5 mg/dL (ref 8.4–10.5)
Chloride: 102 mEq/L (ref 96–112)
Sodium: 143 mEq/L (ref 135–145)

## 2011-01-15 LAB — POCT INR: INR: 1.9

## 2011-01-15 NOTE — Cardiovascular Report (Signed)
[name], [name]               ACCOUNT NO.:  [id number]  MEDICAL RECORD NO.:  [id number]           PATIENT TYPE:  I  LOCATION:  2922                         FACILITY:  [facility]  PHYSICIAN:  Marca Ancona, MD      DATE OF BIRTH:  [birth year]  DATE OF PROCEDURE:  12/22/2010 DATE OF DISCHARGE:                           CARDIAC CATHETERIZATION   PROCEDURES: 1. Coronary angiography. 2. Saphenous vein graft angiography. 3. Left internal mammary artery angiography.  SURGEON:  Marca Ancona, MD  INDICATIONS:  This is a 77 year old with a history of COPD, ischemic cardiomyopathy, coronary disease status post coronary artery bypass grafting.  She was admitted with shortness of breath and found to have probable right lower lobe pneumonia as well as a COPD exacerbation.  She was also noted to be volume overloaded.  Her cardiac enzymes were elevated.  She has been diuresed, COPD has been treated, and it was decided to take her to the left heart catheterization today.  PROCEDURE NOTE:  After informed consent was obtained, the right groin was sterilely prepped and draped.  Lidocaine 1% was used to locally anesthetize the right groin area.  The right common femoral arteries entered using modified Seldinger technique and a long 5-French sheath was placed.  The patient had significant disease in her right iliac system and the vessel was also quite tortuous.  We had to use a Wholey wire to get up the right iliac system.  The right coronary artery was engaged using the JR-4 catheter.  The saphenous vein graft in OM was engaged using the JR-4 catheter.  The saphenous vein graft to diagonal was noted to be totally occluded.  The left main was engaged using JL-4 catheter and the LIMA was engaged using the LIMA catheter.  Left ventriculography was not done due to both chronic kidney disease as well as known moderate aortic stenosis.  FINDINGS: 1. Right coronary artery.  The right coronary artery  is a dominant     vessel.  There is a stented segment in the proximal RCA that     extended out beyond the ostium of the right coronary artery.  There     is also a stented segment in the mid RCA.  There is about 50%     proximal in-stent restenosis in the proximal RCA stent.  The     remainder of the right had luminal irregularities. 2. Left main.  There is a 90-95% ostial left main stenosis. 3. Left circumflex system.  The native circumflex is totally occluded     at the ostium.  There is a large obtuse marginal that is supplied     by patent saphenous vein graft to the obtuse marginal.  That     saphenous vein graft had diffuse disease throughout.  The degree of     stenosis reached about 40-50% and there was nothing critical.     Within the body of the obtuse marginal after the touchdown, there     is an 80-90% stenosis.  This appeared unchanged compared to heart     catheterization back in 2004. 4. LAD system.  The proximal LAD was totally occluded after the     takeoff of a small diagonal.  The LIMA to the LAD was a large     patent vessel, however, flow was quite slow down the distal LAD.     No discrete stenosis was seen.  IMPRESSION:  The patient has a patent but diffusely diseased saphenous vein graft to the obtuse marginal.  The obtuse marginal itself has a 80- 90% stenosis beyond the touchdown, however, this is unchanged from prior catheterization in 2004.  The vein graft to the diagonal is totally occluded.  There is slow flow down the left internal mammary artery, but the distal left anterior descending does appear patent.  There is 50% in- stent restenosis in the proximal right coronary artery stent, remainder of the right coronary artery looks okay.  There is severe native left system disease as has been noted in the past.  I do not see a culprit lesion for acute coronary syndrome.  I suspect her elevation of troponin may have been due to chronic obstructive pulmonary  disease exacerbation with hypoxemia as well as congestive heart failure and volume overload.     Marca Ancona, MD     DM/MEDQ  D:  12/22/2010  T:  12/22/2010  Job:  [id number]  Electronically Signed by Marca Ancona MD on 01/15/2011 11:41:56 PM

## 2011-01-16 NOTE — Op Note (Signed)
NAMESINDY, [name]               ACCOUNT NO.:  [id number]   MEDICAL RECORD NO.:  [id number]          PATIENT TYPE:  AMB   LOCATION:  DAY                          FACILITY:  [facility]   PHYSICIAN:  Charlynne Pander, D.D.S.DATE OF BIRTH:  09-Jan-1934   DATE OF PROCEDURE:  04/08/2006  DATE OF DISCHARGE:                                 OPERATIVE REPORT   PREOPERATIVE DIAGNOSES:  1. Coronary artery disease.  2. Chronic periodontitis.  3. Multiple retained root segments.  4. Rampant dental caries.  5. Bilateral mandibular lingual tori.   POSTOPERATIVE DIAGNOSES:  1. Coronary artery disease.  2. Chronic periodontitis.  3. Multiple retained root segments.  4. Rampant dental caries.  5. Bilateral mandibular lingual tori.   OPERATIONS:  1. Multiple extraction of remaining teeth (tooth numbers 3, 6, 7, 8, 9,      10, 11, 13, 22, 23, 24, 25, 26, 27, 28).  2. Four quadrants of alveoloplasty.  3. Bilateral mandibular lingual tori reductions.   SURGEON:  Charlynne Pander, D.D.S.   ASSISTANCE:  1. Elliot Dally (Sales executive).  2. Ernest Haber (dental student).   ANESTHESIA:  General anesthesia via nasoendotracheal tube.   MEDICATIONS:  1. Ancef 1 gram IV prior to invasive dental procedures.  2. Local anesthesia with a total utilization of four carpules each      containing 36 mg Xylocaine with 0.018 mg of epinephrine as well as two      carpules each containing 9 mg of bupivacaine with 0.009 mg of      epinephrine.   SPECIMEN:  There were 15 teeth which were discarded.   CULTURES:  None.   DRAINS:  None.   COMPLICATIONS:  None.   ESTIMATED BLOOD LOSS:  100 mL.   FLUIDS:  1300 mL lactated Ringer solution.   INDICATIONS:  The patient was recently admitted and had a cholecystectomy.  The patient also with other multiple co-morbidities including coronary  artery disease.  A dental consultation was requested to rule out dental  infection which would affect the patient's  systemic health.  The patient was  examined and treatment planned for multiple extraction of remaining teeth  with alveoloplasty and pre-prosthetic surgery as indicated.  This treatment  plan was formulated to decrease the risk and complications associated with  dental infection from affecting the patient's systemic health.   OPERATIVE FINDINGS:  The patient was examined in operating room #6.  The  teeth were identified for extraction.  The patient was noted to be affected  by chronic periodontitis, multiple retained root segments, rampant dental  caries, and the presence of bilateral mandibular lingual tori.  The  aforementioned necessitated removal of all remaining teeth with alveoplasty  and pre-prosthetic surgery as indicated.   DESCRIPTION OF PROCEDURE:  The patient was brought to the main operating  room #6.  The patient was then placed in supine position on the operating  room table.  General anesthesia was induced via nasoendotracheal tube.  The  patient was then prepped and draped in usual manner for dental medicine  procedure.  A throat pack was  placed at this time.  The oral cavity was  thoroughly examined with the findings as noted above.  The patient was then  ready for the dental medicine procedure as follows:   Local anesthesia was administered sequentially over 2-hour long procedure  with a total utilization of four carpules each containing 36 mg Xylocaine  with 0.018 mg of epinephrine as well as two carpules each containing 9 mg of  bupivacaine with 0.009 mg of epinephrine.   The maxillary quadrants were first approached.  Anesthesia was delivered as  previously described.  A #15 blade incision was made from the maxillary  right tuberosity and extended to the distal of #14.  A surgical flap was  then carefully reflected.  Appropriate amounts of buccal and interseptal  bone was removed around tooth numbers 3, 6, 11 and 13.  The maxillary teeth  were then subluxated with  a series of straight elevators.  Tooth number 3  was then removed with a 53R forceps without complications.  Tooth numbers 6,  7, 8, 9, 10, 11 and 13 were then removed with a 150 forceps without  complications.  Alveoloplasty was then performed utilizing rongeurs and a  bone file.  Tissues were approximated and trimmed appropriately.  The  surgical site was then irrigated with copious amounts of sterile saline.  The surgical site was then closed from the maxillary right tuberosity and  extended to the mesial of number 8 utilizing 3-0 chromic gut suture in a  continuous interrupted suture technique x one.  The maxillary left quadrant  was then closed from distal of #14 and extended to the mesial of #9  utilizing 3-0 chromic gut suture in a continuous interrupted suture  technique x one, one additional interrupted suture was placed to further  close the surgical site.   The mandibular teeth were then approached.  The patient was given bilateral  inferior alveolar nerve blocks with the bupivacaine with epinephrine.  Further infiltration was achieved with Xylocaine with epinephrine.  A #15  blade incision was made from the distal of #19 and extended to the distal of  #30.  A surgical flap was then carefully reflected.  Appropriate amounts of  buccal and interseptal bone were removed around tooth numbers 22, 27 and 28  appropriately.  The lower teeth were then subluxated with a series of  straight elevators.  Tooth #'s 22,23,24,25,26,27,28 were then removed with a  151 forceps without further complications.  At this point in time the  mandibular left torus was visualized and removed with a surgical handpiece  and bur and copious amounts of sterile saline.  The mandibular right tori  was then visualized and removed with a surgical handpiece and copious  amounts of sterile saline.  Alveoplasty was then performed utilizing rongeurs and bone file.  The tissues were approximated and trimmed   appropriately.  The surgical site was then irrigated with copious amounts of  irrigation x3.  The mandibular left quadrant was then closed from the distal  of #19 and extended to the mesial #24 utilizing 3-0 chromic gut suture in a  continuous interrupted suture technique x one.  The mandibular right  quadrant was then closed from the distal of #30 and extended to the mesial  of #25 utilizing 3-0 chromic gut suture in a continuous interrupted suture  technique x one.  At this point in time the entire mouth was irrigated with  copious amounts of sterile saline.  The patient was examined for  complications, seeing none, dental medicine procedure was deemed to be  complete.  The throat pack was removed at this time.  A series of 4x4 gauzes  were placed in the mouth to aid hemostasis.  The patient was then handed  over to the anesthesia team for final disposition.  After appropriate amount  of time the patient was extubated and taken to the post anesthesia care unit  with stable vital signs and good oxygenation level.  All counts correct for  the dental medicine procedure.  The patient will be given appropriate pain  medication utilizing Lortab Elixir appropriately.  The patient will then be  seen in approximately 1 week for evaluation for suture removal.      Charlynne Pander, D.D.S.  Electronically Signed     RFK/MEDQ  D:  04/08/2006  T:  04/08/2006  Job:  [id number]   cc:   Corinna L. Lendell Caprice, MD   Velora Heckler, MD  [address] [place]  [place] [address]

## 2011-01-16 NOTE — Consult Note (Signed)
[name], [name]                           ACCOUNT NO.:  [id number]   MEDICAL RECORD NO.:  [id number]                   PATIENT TYPE:  INP   LOCATION:  2034                                 FACILITY:  [facility]   PHYSICIAN:  Charles A. Delcambre, MD            DATE OF BIRTH:  [birth year]   DATE OF CONSULTATION:  02/01/2004  DATE OF DISCHARGE:                                   CONSULTATION   HISTORY OF PRESENT ILLNESS:  A 77 year old multiparous female who presents  with gynecological issues of vulvar swelling and itching and pain since 3  days ago.  The second day she placed some bacitracin on this area generally  over the vulva.  Wednesday, she felt she was worse and put Preparation H on  the vulva and used Vagisil across the vulva.  Thursday, she used  intravaginal Monistat.  She presented complaining of these issues but was  noted to have concurrent problems with possible early MI as well as multiple  medical problems.  She denies fever and no report of fever was given to me  by ER personnel.  For vitals see ER records.   PAST MEDICAL HISTORY:  1. Congestive heart failure.  2. COPD.  3. CAD.  4. Hypercholesterolemia.  5. PVD.  6. Hypertension.  7. TIA.   PROCEDURES:  Right carotid endarterectomy, coronary artery bypass graft.   OTHER MEDICAL BUT NOT SURGICAL HISTORY:  1. CVA x4.  2. Abdominal aortic aneurysm.   MEDICATIONS:  1. __________ 325 mg once a day.  2. Lopressor 75 mg p.o. b.i.d.  3. Plavix 75 mg p.o. daily.  4. Enalapril 20 mg p.o. daily.  5. Hydrochlorothiazide 25 mg 1/2 tab p.o. daily.  6. Lipitor 80 mg p.o. q.h.s.  7. Niacin 1000 mg q.h.s.  8. Currently per ER, Rocephin 1 g IV.  9. Now on q.24h. IV Lopressor per ER.   ALLERGIES:  CODEINE and SULFA.   SOCIAL HISTORY:  Smoker but denies alcohol or drugs.   REVIEW OF SYSTEMS:  As noted above for in general gynecological.  See chart  for remainder of complaints.   PHYSICAL EXAMINATION:  GENERAL:  Alert,  oriented x3.  VITAL SIGNS:  Per ER chart, afebrile.  LUNGS:  Clear to general auscultation superior fields.  HEART:  Regular rate and rhythm.  ABDOMEN:  Soft, nontender.  PELVIC:  Erythematous vulva, some extension with irritation but not to upper  inner thighs adjacent.  Could be irritation versus cellulitis.  There is a  1/2 cm bullous lesion right at the upper inner thigh on the right.  This is  in the area of irritation or cellulitis within the lateral edge.  Vulva has  exudate material overlying.  I am not sure if this is material that has been  placed externally with multiple placements of materials as noted above.  This appears to be an underlying cellulitis.  No open injuries  are noted.  There is no crepitance.  There is the small bullous area.  Culture of the  exudate and wet prep of the exudate is done.  Internal vagina is without  significant discharge.  The internal vagina is nontender.  External vulva is  moderately to severely tender to palpation.  She does tolerate a gentle  palpation.  Again, no crepitance.  No changes other than the erythema are  noted.   ASSESSMENT:  Vulvar cellulitis versus reactive changes for topical reaction  favor cellulitis.   PLAN:  Continue Rocephin 1 g q.24h., add for the staph coverage nafcillin 2  g IV q.6h.  Consider biopsy if lack of resolution but I would try at this  point not to introduce an open wound.  Will need close follow up.   ADDENDUM:  Laboratory is noted.  Reep blood cell 12.5, hemoglobin 11.1,  hematocrit 33.3, platelets 402.  Differential 85 neutrophils, 5 lymphocytes,  9 monocytes.  Chemistry:  Sodium 133, potassium 3.4, chloride 99, CO2 24,  glucose 108, BUN 11, creatinine 0.9, bilirubin 0.5, alk phos 84, SGOT 25,  SGPT 14, total protein 6.9, albumin 3.5, calcium 10.6.  Otherwise for  cardiac labs see the chart.                                               Charles A. Sydnee Cabal, MD    CAD/MEDQ  D:  02/01/2004  T:   02/02/2004  Job:  [id number]

## 2011-01-16 NOTE — Discharge Summary (Signed)
[facility]  Patient:    [name], [name]                        MRN: [id number] Adm. Date:  [id number] Attending:  Nelta Numbers Dictator:   Tereso Newcomer, P.A.-C.                           Discharge Summary  ADDENDUM DISCHARGE LABORATORY DATA:  Total cholesterol 186, triglycerides 115, HDL 54, LDL 109.  TSH 0.708.  Bury count 10,500, hemoglobin 12.3, hematocrit 36.8, platelet count 362,000.  Sodium 142, potassium 3.7, chloride 110, CO2 of 27, glucose 102, BUN 6, creatinine 0.8.  Post-procedure the patients CPK-MB was 2.4 and troponin I was 0.12. DD:  08/19/00 TD:  08/19/00 Job: [id number] NF/[id number]

## 2011-01-16 NOTE — Discharge Summary (Signed)
NAME:  [name], [name]               ACCOUNT NO.:  [id number]   MEDICAL RECORD NO.:  [id number]          PATIENT TYPE:  INP   LOCATION:  1322                         FACILITY:  [facility]   PHYSICIAN:  Corinna L. Lendell Caprice, MDDATE OF BIRTH:  [birth year]   DATE OF ADMISSION:  03/26/2006  DATE OF DISCHARGE:  04/01/2006                                 DISCHARGE SUMMARY   DISCHARGE DIAGNOSES:  1.  Gallstone pancreatitis.  2.  Acute cholecystitis.  3.  History of congestive heart failure.  4.  Coronary artery disease.  5.  History of coronary artery bypass graft.  6.  Hypertension.  7.  Hyperlipidemia.  8.  History of transient ischemic attack.  9.  History of abdominal aortic aneurysm.   DISCHARGE MEDICATIONS:  Vicodin one to two p.o. q.4h. p.r.n. pain.  She may  continue her other outpatient medications as previous.  Please see H&P for  list.   CONDITION:  Stable.   DIET:  Low salt.   ACTIVITY:  No driving for a week.  No lifting for two weeks.   CONSULTATIONS:  Dr. Samule Ohm, Dr. Derrell Lolling.   PROCEDURE:  Laparoscopic cholecystectomy with intraoperative cholangiogram.   PERTINENT LABORATORIES:  Initial Basquez blood cell count was 14,000.  The  rest of her CBC was unremarkable.  Her Mccart count on March 29, 2006, was 8.  PT/INR normal.  Complete Metabolic Panel on admission was significant for an  AST of 369, ALT of 160, alkaline phosphatase of 137, total bilirubin of 1.5.  Amylase 4576, lipase 4504.  At discharge, her alkaline phosphatase was 92,  SGOT 47, SGPT 51, albumin 2.6, amylase and lipase normal.  Total cholesterol  172.  LDL 113.  HDL 43.  UA negative.  EKG showed sinus tachycardia with  __________, age-indeterminate inferior infarct, and an anteroseptal infarct.  Acute abdominal series showed nonobstructive bowel gas pattern,  cardiomegaly.  CT of the abdomen and pelvis showed gallstones, diffuse  gallbladder wall enhancement, and thickening with mild to moderate  pericholecystic  fluid.  Abdominal aortic aneurysm, slightly larger,  measuring 4.2 x 3.9 cm.  Intraoperative cholangiogram was normal.  Panorex  was done, results pending.   HISTORY AND HOSPITAL COURSE:  Ms. [name] is a pleasant 77 year old [place]  female, patient of Dr. Merri Brunette, who presented to the emergency room  with complaints of abdominal pain and vomiting.  Please see H&P for complete  admission details.  She was afebrile and had a pulse of 116, blood pressure  158/99, normal respiratory rate.  She had epigastric tenderness and had  evidence of gallstone pancreatitis, cholecystitis, cholelithiasis.  Her  liver function tests were elevated, as well.  Surgery deferred admission to  internal medicine but consulted and eventually did a laparoscopic  cholecystectomy after her amylase, lipase, and LFTs had improved.  Cardiology was consulted for preoperative evaluation.  She did well  perioperatively and, at the time of discharge, has no shortness of breath;  her abdominal pain is controlled with pain medications.  She is tolerating a  regular diet.   During her hospitalization, she mentioned concern about multiple  broken  teeth and, apparently, has had difficulties finding a dentist as an  outpatient who could participate in a payment plan.  The surgeon consulted  Dr. Kristin Bruins who ordered a Panorex and will follow up as an outpatient.      Corinna L. Lendell Caprice, MD  Electronically Signed     CLS/MEDQ  D:  04/01/2006  T:  04/01/2006  Job:  [id number]   cc:   Dario Guardian, M.D.  Fax: [phone]   [name], M.D. [facility]  [address]  [address]  [place]  [place] [address]   Velora Heckler, MD  [address]  [place]  [place] [address]   Charlynne Pander, D.D.S.  Fax: [phone]

## 2011-01-16 NOTE — Discharge Summary (Signed)
[place]. [facility]  Patient:    [name], [name]                        MRN: [id number] Adm. Date:  [id number] Disc. Date: 08/19/00 Attending:  Nelta Numbers Dictator:   Tereso Newcomer, P.A. CC:         [facility], Dr. Silvana Newness   Discharge Summary  DATE OF BIRTH:  Jul 29, 1934  REASON FOR ADMISSION:  Chest pain.  DISCHARGE DIAGNOSES: 1. Coronary artery disease. 2. Status [place] [facility] [place]. 3. Hyperlipidemia. 4. Hypertension. 5. Peripheral vascular disease. 6. Status post transient ischemic attack x 4. 7. Status post right carotid endarterectomy in 1998. 8. Tobacco abuse. 9. Small distal abdominal aortic saccular aneurysm measuring up to 2.8 cm in    maximal dimension.  PROCEDURES PERFORMED THIS ADMISSION: 1. Cardiac catheterization by E. Graceann Congress, M.D. on August 18, 2000,    revealing left main 20% distal, LAD totaled, circumflex totaled, ostial    stenosis, RCA 80% segmental proximal, SVG to diagonal unable to be    visualized, SVG to OM patent with 50% stenosis, LIMA to LAD patent, 1 to 2+    AR. 2. Status post percutaneous transluminal coronary angioplasty and stent    placement in the RCA by Arturo Morton. Riley Kill, M.D. on August 18, 2000, with    reduction of stenosis from 80 to 0%. 3. Carotid Doppler ultrasound for bruit noted on physical exam revealing no    ICA stenosis. 4. Abdominal ultrasound. Impression as read by the radiologist Dr. Vear Clock    probable fundal cholesterol polyp, tiny floating echogenicities consistent    with cholesterol crystals with slight pericholecystic fluid and absence of    generalized ascites - cholecystitis is likely, biliary ______  may    confirm. Small distal abdominal aortic saccular aneurysm measuring up to    2.8 cm in maximum dimension. 5. Limited visualization of the pancreas. Otherwise normal.  ADMISSION  HISTORY OF PRESENT ILLNESS:  This 77 year old [name] with the above-note history was seen in the emergency room at [facility] on the day of admission for complaints of chest pain. 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 She had associated nausea, diaphoresis, and dizziness. She denied shortness of breath. She sat down, and her pain radiated into her substernal chest. It was an ache-type pain. She rated it as 6/10. She had similar symptoms 1 to 2 weeks prior but with no chest pain. EMS was called. Her symptoms got better with O2 but resolved with nitroglycerin drip in the emergency room. Upon initial evaluation, she was pain free. She denied PND, orthopnea, or exertional chest symptoms recently.  ALLERGIES:  CODEINE and SULFA.  INITIAL PHYSICAL EXAMINATION:  GENERAL:  Well-nourished, well-developed female in no acute distress.  VITAL SIGNS:  Blood pressure 147/77, heart rate 105 to 115, respirations 20.  NECK:  Without JVD. Positive left bruit. No right bruit.  CARDIAC:  S1/S2. Regular rate and rhythm, 2/6 systolic murmur at left lower sternal border.  LUNGS:  Clear to auscultation bilaterally.  EXTREMITIES:  Without edema.  LABORATORY DATA:  EKG:  Heart rate 91, normal sinus rhythm, left axis deviation, Q waves in V1 through V3, no acute changes.  Chest x-ray:  No significant interval change.  Labs:  CK 85, MB  1.3, troponin I 0.01. INR 1. Sodium 139, potassium 4, chloride 105, CO2 23, BUN 11, creatinine 1, glucose 123.  HOSPITAL COURSE:  The patient was admitted for chest pain. She was ruled out for myocardial infarction by enzymes. She was placed on heparin, aspirin, and nitroglycerin drip. Lopressor 25 mg twice daily was added. Carotid Doppler ultrasound was checked to assess the patients bruit. The results are noted above. She was complaining of some abdominal pain on the morning of August 17, 2000.  Therefore, the abdominal ultrasound was checked. The results are noted above. The abdominal ultrasound was reviewed by Arturo Morton. Riley Kill, M.D. He noted that there was no definite cholecystitis. The patient went for cardiac catheterization on August 18, 2000, by Cecil Cranker, M.D. The results are noted above. She successfully underwent PCI by Arturo Morton. Riley Kill, M.D. On the morning of August 19, 2000, she was found to be in stable condition with no recurrence of symptoms. Arturo Morton. Stuckey, M.D. noted that it was hard to know if her symptoms were ischemic. This was not definite. She had no significant fever. She was somewhat tachycardic, and her blood pressure was somewhat elevated at 145/90. Her chest x-ray was okay. She had Lopressor stopped during her admission. Altace had been started, but this was again switched to Lopressor prior to discharge due to her increased heart rate. At the time of this dictation, the patient is to be monitored for several hours today before discharge to home today to make sure she will tolerate Lopressor.  DISCHARGE MEDICATIONS: 1. Lopressor 25 mg b.i.d. 2. Coated aspirin 325 mg q.d. 3. Plavix 75 mg q.d. 4. Zocor 80 mg q.h.s. 5. Clonidine 0.1 mg b.i.d. 6. Zyrtec 10 mg a day. 7. Nitroglycerin 0.4 mg sublingual p.r.n. chest pain.  ACTIVITIES:  No driving, heavy lifting, exertion, or sex for 3 days.  DIET:  Low-fat, low-salt diet.  INSTRUCTIONS:  Watch her groin for any increased swelling, bleeding, or bruising. Call office with concerns.  FOLLOW-UP:  She is to follow up with Dian Queen, P.A.C., physician assistant, on September 02, 2000, at 9:15 a.m. at our office in [place], and she is to call if there are problems with her appointment. She is to follow up with her primary care physician as needed. DD:  08/19/00 TD:  08/19/00 Job: [id number]

## 2011-01-16 NOTE — H&P (Signed)
[place]. [facility]  Patient:    [name], [name]                        MRN: [id number] Adm. Date:  [id number] Attending:  Glean Hess D                         History and Physical  CHIEF COMPLAINT:  Right-sided numbness and weakness.  HISTORY OF PRESENT ILLNESS:  Patient is a 77 year old woman who around 3 p.m.,developed sudden onset of right arm numbness, hand weakness and right lower extremity weakness and numbness as well, episode which lasted less than one minute. She denies slurred speech; no visual changes, no ataxia, no vertigo.  Following  these symptoms, she had mild nausea and bitemporal dull, moderate-intensity headache, which has resolved at this time.  Patient had a prior history of strokes in 1980, right middle cerebral artery distribution, and two other strokes in the 1990s, with left hemiparesis again.  She was operated for a carotid endarterectomy in 1998 for 89% stenosis of the right ICA.  Since then, she has not had any symptoms in regards to TIAs or strokes.  She is currently on aspirin for stroke  prevention.  PAST MEDICAL HISTORY:  Hypertension, stroke, right carotid endarterectomy, coronary artery disease, status post CABG x 3 in 1996.  She had a previous biopsy of the  left breast in 1998 which was benign.  MEDICATIONS: 1. Allegra 60 mg twice a day. 2. Lopressor 50 mg once a day. 3. Zocor 20 mg once a day. 4. Aspirin 325 mg once a day. 5. Recently, she had been started on a medication which she described as blood    vessel dilator, of which she could not remember the name.  PRIMARY CARE PHYSICIAN:  ______ at [facility].  ALLERGIES:  She is allergic to CODEINE and SULFA.  SOCIAL HISTORY:  Lives by herself; very independent.  She smokes between two to  four packs on a daily basis.  She admits to drinking on an occasional basis but her son refers she drinks more than that, beers, and sometimes she can get  drunk.  FAMILY HISTORY:  Mother died of complications of hypertension and her father had a stroke as well and died of complications of coronary artery disease.  REVIEW OF SYSTEMS:  As per history of present illness.  PHYSICAL EXAMINATION:  VITAL SIGNS:  Blood pressure 136/92, pulse 101, respirations 20, temperature 98.6.  GENERAL:  Patient is lying on the stretcher in no distress.  HEENT:  Head normocephalic, atraumatic.  NECK:  No bruits.  LUNGS:  Clear bilaterally.  HEART:  Regular without murmurs.  ABDOMEN:  Soft.  Bowel sounds present.  No visceromegaly.  EXTREMITIES:  No cyanosis or edema.  NEUROLOGIC:  Patient is awake and alert and oriented.  Speech fluent.  Memory and language normal.  Cranial nerves II-XII:  Her pupils are equal and reactive bilaterally.  Extraoculocephalic movements are present.  Face symmetric. Tongue in the midline.  Motor examination:  Strength full bilaterally, 5/5.  DTRs +1 throughout.  Plantars downgoing.  Sensory normal.  Coordination is normal. Gait evaluation was deferred at this time.  LABORATORY DATA:  WBC 7.4, hemoglobin 14, hematocrit 40.  INR 1.0.  Platelets 366,000.  Sodium 132, potassium 3.1, bicarb 25, chloride 98, BUN 15, creatinine  0.9, glucose 98, calcium 9.5.  EKG:  Normal sinus rhythm with left axis deviation.  IMAGING  STUDIES:  CT scan of the head, which I have personally reviewed, shows o acute ischemic changes, no hemorrhage, no hydrocephalus.  IMPRESSION: 1. Left hemispheric transient ischemic attack. 2. Hypertension. 3. History of strokes. 4. Right carotid endarterectomy in 1998. 5. Coronary artery disease, status post coronary artery bypass graft.  PLAN AND RECOMMENDATIONS:  The diagnosis, condition and further interventions were discussed at length with the patient and her son at the bedside.  Since the patient has significant risk factor for a stroke and most recent symptoms of TIA and risk of a  stroke in the next 30 days, it was advised for the patient to be admitted o the hospital for further workup and testing for cerebrovascular disease. Initial management will consist of hemodynamic support with IV fluids of normal saline t 100 cc/hr, oxygen 2 L and heparin ischemic stroke protocol.  Further testing will include noninvasive imaging of the intra and extracranial vessels with MRI/MRA f the brain and also echocardiogram for evaluation of potential embolic sources as the cause of her stroke.  Patient will remain on heparin until her workup is completed, after which decisions will be made in regards to long-term secondary  stroke prevention.  Thank you for allowing me to participate in the care of this patient.DD:  11/07/99 TD:  11/08/99 Job: [id number]

## 2011-01-16 NOTE — Assessment & Plan Note (Signed)
[facility]                              CARDIOLOGY OFFICE NOTE   NAME:Dhaliwal, KRISHA BEEGLE                      MRN:          [id number]  DATE:05/10/2006                            DOB:          1934-02-04    HISTORY OF PRESENT ILLNESS:  Ms. [name] is a 77 year old lady with coronary  artery disease for which she is status post coronary artery bypass grafting  in 1996 and multiple subsequent percutaneous interventions.  Her ejection  fraction is 35 to 45%.  She also has moderate mitral regurgitation and mild  aortic insufficiency.  She has had abdominal aortic aneurysm with the  aneurysmal segment extending into both iliac arteries which we have been  following.  This remains asymptomatic.   In the six months since I last saw her Ms. Autrey has been doing well.  She  has had no chest discomfort, exertional dyspnea, paroxysmal nocturnal  dyspnea, orthopnea, edema, syncope, pre syncope or claudication.  She has  not had any abdominal or back discomfort.  She remains off tobacco.  No  symptoms concerning for TIA or stroke.   CURRENT MEDICATIONS:  1. Lipitor 80 mg per day.  2. Enalapril 20 mg twice per day.  3. Iron 325 mg three times per day.  4. Aspirin 81 mg per day.  5. Lasix 40 mg per day.  6. Zetia 10 mg per day.  7. Imdur 30 mg per day.  8. She is prescribed bisoprolol per day but has run out of it.   PHYSICAL EXAMINATION:  GENERAL APPEARANCE:  She is generally well-appearing  and in no distress.  VITAL SIGNS:  Heart rate 94, blood pressure 138/88 and weight 107 pounds.  Weight is down 9 pounds from a year ago.  NECK:  She has no jugular venous distention and no thyromegaly.  LYMPHADENOPATHY:  There is no cervical, supraclavicular, axillary  lymphadenopathy.  CHEST:  Respiratory effort is normal.  Lungs are clear to auscultation.  CARDIAC:  She has a nondisplaced point of maximal cardiac impulse.  There is  a regular rate and rhythm without  murmur, rub or gallop.  ABDOMEN:  Soft, nondistended, nontender.  There is no hepatosplenomegaly and  no pulsatile midline mass.  Bowel sounds are normal.  EXTREMITIES: Warm and without edema.  Carotid pulses 2+ bilaterally without  bruits.  Femoral pulses 2+ bilaterally.   LABORATORY DATA:  Abdominal ultrasound performed May 07, 2006  demonstrates slight growth in her abdominal aortic aneurysm, now at 4.1 x  4.2 cm versus 4.0 by 4.0 in March.  However, her left common iliac artery  aneurysm has increased from 1.7 x 2.3 to 2.1 x 2.5 cm.   Electrocardiogram today demonstrates normal sinus rhythm with biatrial  enlargement and left axis deviation and possible septal infarct.   IMPRESSION AND RECOMMENDATIONS:  1. Coronary disease, doing well.  Remains asymptomatic with ejection      fraction 35 to 45%.  Continue daily aspirin.  2. Impaired ejection fraction.  Continue ACE inhibitor at present dose.      Resume bisoprolol. I emphasized the critical importance  of compliance      with her.  3. Hypercholesterolemia.  Continue Lipitor and Zetia.  Check lipids and      liver function tests.  4. Abdominal aortic aneurysm and iliac aneurysm.  Significant progression      in the size of the iliac aneurysm.  Will refer her to Dr. Cari Caraway      for advice on treatment.  5. Moderate renal artery stenosis.  Continue medical therapy.  6. Tobacco abuse.  Congratulated her on remaining off tobacco.  7. Hypertension.  Hopefully will improve with the resumption of the      bisoprolol.  8. Chronic obstructive pulmonary disease.  Asymptomatic at present.  9. Peptic ulcer disease.  Asymptomatic.   DISPOSITION:  I will see her back in six months time.                                 [name], MD    WED/MedQ  DD:  05/24/2006  DT:  05/26/2006  Job #:  [id number]   cc:   Lurlean Horns. Edilia Bo, M.D.

## 2011-01-16 NOTE — Cardiovascular Report (Signed)
[name], [name]                           ACCOUNT NO.:  [id number]   MEDICAL RECORD NO.:  [id number]                   PATIENT TYPE:  INP   LOCATION:  2927                                 FACILITY:  [facility]   PHYSICIAN:  [name], M.D. [facility]         DATE OF BIRTH:  [birth year]   DATE OF PROCEDURE:  10/23/2002  DATE OF DISCHARGE:                              CARDIAC CATHETERIZATION   PROCEDURE:  Stents to the ostial and mid right coronary artery.   INDICATIONS FOR PROCEDURE:  The patient is a 77 year old lady, status post  CABG in 1996, who now presents with 3 weeks of progressive congestive heart  failure.  She ruled in for subendocardial myocardial infarction.  On  October 20, 2002, she underwent cardiac catheterization demonstrating a 70%  stenosis at the ostium of the unbypassed RCA, widely patent proximal RCA  stent, but a 90% stenosis of the mid RCA after the takeoff of the first  diagonal branch.  The LIMA to the LAD and saphenous vein graft to obtuse  marginal both remained widely patent.  She was therefore referred for  percutaneous intervention on the RCA.  This procedure was not performed on  October 20, 2002, to allow further resolution of her congestive heart  failure and correction of hypokalemia.   PROCEDURAL TECHNIQUE:  Informed consent was obtained.  A 6-French sheath was  placed in the right femoral artery using the modified Seldinger technique.  Anticoagulation was initiated with heparin and Integrilin to achieve an ACT  of greater than 200 seconds.  A 6-French JR4 guide with sideholes was  advanced over a wire and engaged in the ostium of the RCA.  A BMW wire was  advanced without difficulty into the distal vessel.  The lesion in the mid  vessel was predilated using a 2.25 x 12-mm Quantum balloon at 10  atmospheres.  This balloon was then pulled back to the ostium of the vessel,  and the ostium predilated at 14 atmospheres.  The lesion in the mid  vessel  was then stented using a 2.5 x 13-mm Cypher, deployed at 14 atmospheres.  The lesion of the ostial RCA was then stented using a 3.0 x 13-mm Cypher,  deployed at 16 atmospheres.  This ostial stent was positioned so as to  protrude approximately 1 mm from the ostium of the vessel and overlaps the  proximal RCA stent by approximately 2 mm.  The mid RCA lesion was then post-  dilated using a 2.5 x 13-mm PowerSail at 14 atmospheres.  The lesion of the  ostial RCA was then post-dilated using a 2.25 x 13-mm PowerSail at 14  atmospheres in the distal stent, and 18 atmospheres in the proximal stent.  Final angiograms demonstrated no residual stenosis at the ostium or mid  vessel.  There remained approximately 40% in-stent restenosis at the  proximal portion of the previously placed stent.  There was a step-up  into  the mid RCA stent to the slight __________ branch.  There was no dissection,  and TIMI-3 flow to the distal vessel.   IMPRESSION/PLAN:  Successful stenting of both the ostial and mid right  coronary artery using drug-eluting stents and resulting in no residual  stenosis and TIMI-3 flow.  The plan will be for continued medical management  of her congestive heart failure, including the potential for residual  ischemia.  Aspirin will be continued indefinitely.  Plavix will be continued  for a minimum of 3 months, given her drug-eluting stents.  Strong  consideration should be given to 9 months of Plavix therapy.  Integrilin  will be continued for 18 hours.  Sheaths will be removed when the ACT is  less than 150 seconds.                                               [name], M.D. [facility]   WED/MEDQ  D:  10/23/2002  T:  10/23/2002  Job:  [id number]   cc:   Dala Dock

## 2011-01-16 NOTE — H&P (Signed)
[name], [name]               ACCOUNT NO.:  [id number]   MEDICAL RECORD NO.:  [id number]          PATIENT TYPE:  EMS   LOCATION:  ED                           FACILITY:  [facility]   PHYSICIAN:  Michelene Gardener, MD    DATE OF BIRTH:  15-Oct-1933   DATE OF ADMISSION:  03/26/2006  DATE OF DISCHARGE:                                HISTORY & PHYSICAL   PRIMARY CARE PHYSICIAN:  Dario Guardian, M.D.   CHIEF COMPLAINT:  Abdominal pain, nausea, and vomiting that started today.   HISTORY OF PRESENT ILLNESS:  This is a 77 year old Caucasian female with a  past medical history of multiple medical problems who presented with the  above-mentioned complaint.  She stated that she was doing fine until 2 p.m.  when she started having some abdominal pain associated with nausea and  vomiting.  The abdominal pain was described as diffuse, but mostly in the  epigastric, periumbilical area. It is described as sharp pain around 10/10  when she came in and currently around 3 to 4 out of 10.  She had some  radiation to her back and associated with nausea and vomiting which she  vomited around seven to eight times of food contents without any blood.  She  denied diarrhea or hematemesis.  CT scan of the abdomen was done in the ER  and showed some gallstones with cholecystitis and her amylase and lipase  were elevated.   PAST MEDICAL HISTORY:  1. Congestive heart failure.  2. Coronary artery disease, status post CABG.  3. Hypertension.  4. Hyperlipidemia.   PAST SURGICAL HISTORY:  Status post CABG done in 1996 in [place], [place].   MEDICATIONS:  1. Lasix 40 mg p.o. once daily.  2. Zetia 10 mg p.o. once daily.  3. Lipitor 80 mg p.o. once daily.  4. Enalapril 10 mg p.o. twice daily.  5. Ferrous sulfate 325 mg p.o. twice daily.  6. Clonidine 0.1 mg p.o. twice daily.  7. Imdur 30 mg p.o. once daily.  8. Albuterol two puffs p.r.n.   ALLERGIES:  1. CODEINE --which she gets abdominal  discomfort from that.  2. SULFA--the patient breaks out in rash and hives from that.   SOCIAL HISTORY:  The patient smokes around one pack since 1970.  She drinks  alcohol occasionally and she denies any recreational drugs.   FAMILY HISTORY:  Significant for hypertension and heart problems.   REVIEW OF SYSTEMS:  CONSTITUTIONAL:  There is no fever, no fatigability, no  weight changes.  EYES:  No blurred vision, no pain.  ENT:  No difficulty  swallowing and no epistaxis.  CARDIOVASCULAR:  No chest pain, no shortness  of breath, no palpitations.  CHEST:  No cough or hemoptysis.  GI:  Positive  for nausea, vomiting, abdominal pain.  There is no diarrhea and no  hematemesis.  GU:  No hematuria, no dysuria.  ENDOCRINE:  No polyuria, no  nocturia.  ID:  No rash, no lesions.  HEMATOLOGY:  No bruises, no bleeding.  NEUROLOGIC:  No numbness, tingling, or weakness.  The rest  of the systems  are reviewed and they are negative.   PHYSICAL EXAMINATION:  VITAL SIGNS:  Temperature is 97.5, blood pressure  158/99,  pulse 116, respiratory rate 20.  GENERAL APPEARANCE:  This is an elderly Caucasian female lying down in bed,  in no acute distress at the present time.  HEENT:  Conjunctiva is normal.  No pallor or erythema.  Pupils are equal and  reactive to light and accommodation.  There is no ptosis.  Hearing is  intact.  There is no ear discharge or infection.  There is no nasal  discharge or bleeding. Oral mucosa is moist and there is no pharyngeal  erythema.  NECK:  Supple.  No JVD, no carotid bruits, no lymphadenopathy, no thyroid  enlargement or thyroid tenderness.  CARDIOVASCULAR:  S1 and S2.  There is no additional heart sounds . There are  no murmurs, rubs, gallops, or thrills.  RESPIRATORY:  The patient is breathing between 16 to 18.  There is no use of  accessory muscles.  No intercostal retractions.  No rales, ronchi or  Wheezes.  ABDOMEN:  Soft, nondistended. There is positive tenderness  in the epigastric  region.  There are decreased bowel sound. There is no hepatosplenomegaly.  EXTREMITIES:  Lower extremities reveal no edema.  SKIN:  No rash or erythema.  NEUROLOGIC:  Cranial nerves II-XII intact. No motor or sensory deficits.  PSYCHIATRIC:  The patient is alert and oriented times three.  There recent  or remote impairment.   LABORATORY RESULTS:  WBC 14.6, hemoglobin 13.8, hematocrit 40.7, MCV 88.4,  platelet count 411,000.  Myoglobin is 69.1, CK-MB 2.0, troponin less than  0.5.  Amylase is 4576.  Sodium 141, potassium 3.5, chloride 104, bicarbonate  29, glucose 132, BUN 14, creatinine 1.1, calcium 9.5, total protein 7.3,  albumin 3.4, AST 369, ALT 160, alkaline phosphatase 157, lipase 4504.   CT scan of the abdomen showed cholelithiasis with findings suspicious for  acute cholecystitis.   IMPRESSION:  1. Gallstone pancreatitis. As mentioned this patient's amylase and lipase      are more than 4000.  We will admit this patient to the floor. We will      keep her NPO.  We will start her on IV fluids and will do this      cautiously because of her previous history of congestive heart failure.      We will give her pain medications as well as antiemetics.  We will also      start her on antibiotics.  I already spoke to the surgeon on call who      states that he will see her in the morning and they will stabilize her      infection and then probably do a cholecystectomy in a few days.  2. Gallstone cholecystitis.  Her management to be at #1 where we will keep      the patient NPO and give her some IV fluids and will keep her on      antibiotics.  The patient will undergo cholecystectomy if her amylase      and lipase are corrected. If they are not corrected in two or three      days then we will get GI consultation for possible ERCP.  3. Elevated liver function tests.  We will review her LFTs and get a     hepatitis profile.  4. History of coronary artery disease.   This is stable at the present  time. We will continue her current medications.  5. Hypertension. Continue her current medications.  6. History of congestive heart failure.  Her current x-ray is showing no      congestion and there is no evidence of      acute exacerbation.  Will continue her current medications and will      give her IV fluids very cautiously to prevent any congestion.   Total assessment time is one hour.      Michelene Gardener, MD  Electronically Signed     NAE/MEDQ  D:  03/26/2006  T:  03/26/2006  Job:  [id number]   cc:   Dario Guardian, M.D.  Fax: [phone]

## 2011-01-16 NOTE — H&P (Signed)
NAMEDEYSI, SOLDO                           ACCOUNT NO.:  [id number]   MEDICAL RECORD NO.:  [id number]                   PATIENT TYPE:  INP   LOCATION:  2927                                 FACILITY:  [facility]   PHYSICIAN:  [name], M.D. [facility]         DATE OF BIRTH:  1934-02-23   DATE OF ADMISSION:  10/19/2002  DATE OF DISCHARGE:                                HISTORY & PHYSICAL   CHIEF COMPLAINT:  Shortness of breath.   HISTORY OF PRESENT ILLNESS:  The patient is a 77 year old [place] female with  coronary artery disease status post CABG in 1996 (LIMA to the LAD, a  saphenous vein graft to the diagonal, a saphenous vein graft to the OM) with  ASCVD status post PTCA stent to the RCA in December of 2001, heavy tobacco  use, hypertension, hyperlipidemia, and peripheral vascular disease status  post TIA x4 status post right CEA in 1998, admitted through [facility] ER  with 3-week history of progressive dyspnea leading to orthopnea.  The  patient was evaluated in [facility] ER on October 10, 2002 and treated with  bronchodilators and IV Lasix.  At that time her BNP was over 1300.  She was  discharged to home and followed up with her [facility] several  days later.  She was scheduled for an outpatient echocardiogram for February  26th but had an acute worsening of her dyspnea on the evening prior to  admission.  She denies any history of chest pain, fevers, chills, nausea,  vomiting, or diarrhea.  She does report some occasional cough with [place]  sputum and orthopnea which she is supposedly recent in onset.  The patient's  respiratory symptoms improved in the ER after treatment with IV Lasix.   PAST MEDICAL HISTORY:  1. Coronary artery disease status post CABG in [place] in 1996.  Again,     LIMA to the LAD, a saphenous vein to the diagonal, a saphenous vein to     the OM, and status post PTCA stent to the RCA in December 2001 [facility].  2.  Cerebrovascular disease status post TIA x4 status post right carotid     endarterectomy in 1998.  Note, there was no ICA stenosis by December of     2001 on carotid Dopplers.  3. Hyperlipidemia.  4. Hypertension.  5. Peripheral vascular disease.  6. History of small distal saccular aortic aneurysm 2.8 cm at largest     diameter by 2001 study.   CURRENT MEDICATIONS:  1. Zestoretic 20/12.5 mg which she has been out of for several days.  2. Metoprolol 50 mg p.o. b.i.d.  3. Clonidine 0.1 mg p.o. b.i.d.  4. Aspirin 325 mg p.o. daily.  5. Plavix 75 mg p.o. daily.   ALLERGIES:  CODEINE causes nausea and vomiting, and SULFA causes hives.   FAMILY HISTORY:  Notable for coronary artery disease, and father  died in 7s  of alcohol and hypertension.   SOCIAL HISTORY:  She is a tobacco user of 27 years.  Quit three weeks ago.  She does report moderate EtOH use.  She is independent of all ADLs and  divorced and lives alone.   REVIEW OF SYSTEMS:  Again, positive for dyspnea and orthopnea progressive  over the last three weeks with cough productive of [place] sputum.  Denies  fevers, chills, chest pain, palpitations, and reflux symptoms.  No  hematochezia or melena.  No nausea, vomiting, or diarrhea.  No lower  extremity swelling.  Review of systems is positive for a reported persistent  numbness bilaterally in the hands over the last three months.   PHYSICAL EXAMINATION:  GENERAL:  This is an elderly dyspneic female  initially in moderate respiratory distress now improved.  VITAL SIGNS:  On admission, temp 96.4, blood pressure 146/97, pulse 83 and  regular, respiratory rate 94.  Saturating 95% on room air.  In the CCU,  current vital signs temp 98, blood pressure 128/65, heart rate 111 and  regular, respiratory rate 24.  Saturations are 100% on 100% nonrebreather.  HEENT:  Was within normal limits with the exception of JVD noted to  approximately 13 cm.  LUNGS:  Notable for rales two-thirds of  the way up posteriorly and  bilaterally.  CARDIOVASCULAR:  Tachycardic but regular.  There was a question of a gallop  and a 2/6 systolic murmur at the left sternal border.  ABDOMEN:  Soft and nontender and nondistended with positive bowel sounds,  and no hepatosplenomegaly.  No bruits.  EXTREMITIES:  Warm and without clubbing, cyanosis, or edema.  Peripheral  pulses were 1+ bilaterally.  NEUROLOGICAL:  Nonfocal.   IMAGING STUDIES:  A chest x-ray showed pulmonary edema but mild cardiomegaly  and a focal airspace disease in the right upper lobe.  EKG shows sinus  tachycardia, rate 116 and with T-waves in the anterior septal leaves with  approximately 1 mm of ST elevation and biatrial enlargement.   LABORATORY DATA:  An ABG on 75% FiO2 was as follows:  a pH of 7.4, PCO2 of  35, PO2 62, O2 saturation 92% at the time.  BNP was 2880.  First set of  cardiac enzymes CK 51, MB 3.1, troponin-I 0.76.  Second set total CK was 62  and troponin-I was 0.56.  A PT was 13.0 with an INR of 0.9, PTT was 32.  A  basic metabolic panel was as follows:  Sodium 121, potassium 4.2, chloride  94, bicarb 19, BUN 8, creatinine 0.6, glucose 136, total bili 1.1, alk phos  78, AST 18, ALT 18, total protein 6.4, albumin 3.2, calcium  8.9.  CBC with  differential showed a Ronan count of 14.9, hemoglobin of 12, hematocrit of  35, platelets 372 with an elevated ANC at 12.7 and elevated AMC at 1.3.   IMPRESSION:  A 77 year old [place] female with known coronary artery disease  presents with signs and symptoms consistent with systolic failure plus or  minus pneumonia.  She is hypoxic on exam with evidence of volume overload  with pulmonary edema on chest x-ray.  She will be admitted to the Critical  Care Unit for further evaluation and treatment.   PROBLEM LIST:  1. Congestive heart failure acute with decompensation.  Will wean O2 as    needed and provide afterload reduction with ACE inhibitor and     nitroglycerin drip  titrated to blood pressure systolic of 100.  Continue     heparin drip while ruling out for acute myocardial infarction with serial     cardiac enzymes.  A 2-D echo today to evaluate ventricular function and     valvular function.  There is no history of aortic stenosis per old     records but there was mention of 1-2+ aortic regurgitation on previous     cath report.  She will be scheduled for a cardiac catheterization,     biventricular, in the following 24 hours to evaluate patency of previous     grafts and stent.  We will also look at her renal arteries to rule out     renal artery stenosis given her history of hypertension and     atherosclerotic disease.  2. Elevated troponin's.  Acute myocardial infarction versus congestive heart     failure versus pulmonary embolus.  History is not suggestive of a     pulmonary embolism and elevated troponin can be caused by congestive     heart failure but we will continue to rule out for an acute myocardial     infarction.  Dr. Delford Field of pulmonary critical care has ordered lower     extremity Dopplers to rule out deep vein thrombosis and I agree with     this.  Continue cycling enzymes, heparin drip, aspirin every day, and     precatheterization orders have been placed on chart.  3. Hypertension.  Will hold the beta blocker for now given her acute     decompensation.  Continue diuresis with IV Lasix and continue ACE     inhibitor.  4. Hypoxia.  Likely multifactorial due to pulmonary edema plus or minus     right upper lobe infiltrate plus chronic obstructive pulmonary disease.     Continue oxygen, antibiotics as started by pulmonary CCM and     bronchodilator nebs for history of chronic obstructive pulmonary disease.  5. Hyperlipidemia.  The patient is not on a statin and clearly needs to be     on one given her history of arteriosclerotic cardiovascular disease.     Will check a fasting lipids for baseline and initiate therapy with     Lipitor  10 mg p.o. daily.  Baseline liver function tests are normal.     Duncan Dull, M.D.                        [name], M.D. [facility]    TT/MEDQ  D:  10/19/2002  T:  10/19/2002  Job:  [id number]

## 2011-01-16 NOTE — Procedures (Signed)
[place]  Patient:    [name], [name]                        MRN: [id number] Proc. Date: 08/18/00 Adm. Date:  [id number] Attending:  Nelta Numbers CC:         Cardiac Catheterization Laboratory  [facility]  Gerrit Friends. Dietrich Pates, M.D. [facility]   Procedure Report  PROCEDURE:  Selective coronary angiography, left ventricular angiography, aortic root angiography, left internal mammary artery angiography, saphenous vein graft angiography, Judkins technique.  CARDIOLOGIST:  Cecil Cranker, M.D.  INDICATIONS:  The patient is a 77 year old [place] female, a heavy smoker, with a history of CABG at [facility] in April 1996, with a LIMA anastomosed to the LAD, SVG to the diagonal, and SVG to the OM.  The patient presents now with prolonged chest pain, without enzymes.  DESCRIPTION OF PROCEDURE:  The patient tolerated the procedure well.  We were unable to selectively engage or visualize the SVG to the diagonal, despite multiple catheters.  RESULTS: PRESSURES: Aortic systolic pressure 157. Diastolic pressure:       76. LV systolic pressure:    157. Diastolic pressure:        8.  ANGIOGRAPHY: 1. Left main coronary artery:  The left main coronary artery revealed a    70% lesion distally. 2. Left anterior descending coronary artery:  The LAD revealed a high-grade    stenosis, and was essentially totally-occluded after the septal. 3. Circumflex coronary artery:  The circumflex was totally-occluded at the    ostium. 4. Right coronary artery:  The right coronary artery was not bypassed, and had    an 80% segmental lesion proximally. 5. Left ventricle.  The left ventricle revealed a normal ejection fraction.    There was focal hypokinesis of the apex. 6. Aortic root angiogram:  This revealed 1-2+ aortic regurgitation.  The    saphenous vein graft to the obtuse marginal was visualized, but there    was no visualization of the SVG  to the diagonal.  BYPASS GRAFTS: 1. Left internal mammary artery to the left anterior descending coronary    artery:  This was widely patent. 2. Saphenous vein graft to the obtuse marginal:  This revealed a 50%    lesion after the anastomosis.  There was some retrograde filling of    the small branch of the OM-II. 3. Saphenous vein graft to the diagonal:  This could not be selectively    engaged, and was not seen, or on the aortic root angiogram.  This may    be previously occluded.  RECOMMENDATIONS:  The patient will be reviewed with Dr. Arturo Morton. Riley Kill, will consider a percutaneous intervention of the native right coronary artery.DD:  08/18/00 TD:  08/18/00 Job: [id number]

## 2011-01-16 NOTE — Cardiovascular Report (Signed)
[place]  Patient:    [name], [name]                        MRN: [id number] Proc. Date: 08/18/00 Adm. Date:  [id number] Attending:  Nelta Numbers CC:         Cath Lab  Cecil Cranker, M.D. [facility]  Gerrit Friends. Dietrich Pates, M.D. [facility]  [facility]   Cardiac Catheterization  INDICATIONS:  Ms. [name] is 77 years old.  She has previously undergone coronary artery bypass graft in [place].  She presented with some recurrent discomfort and underwent catheterization study.  This demonstrated continued patency of the vein graft to the obtuse marginal and internal mammary to the LAD, the vein graft to the diagonal was occluded.  The native right had a proximal lesion in the first bend of 80%.  Percutaneous intervention was recommended.  She was brought to the catheterization laboratory with an indwelling sheath.  PROCEDURE:  Percutaneous stenting of the proximal right coronary artery.  DESCRIPTION OF PROCEDURE:  The patient was brought to the catheterization lab and prepped and draped in the usual sterile fashion.  After careful preparation, the 7 French sheath was exchanged for an 8 [place] sheath with double glove technique.  JR4 guiding catheter with sideholes was utilized to intubate the right coronary artery.  A long 0.014 Hi-Torque floppy wire was passed across the lesion.  The lesion was then covered with an 18 mm x 2.5 mm AVE stent.  This was taken up to about 14 atmospheres with marked improvement in the appearance of the artery.  We then took an open cell 10 mm length balloon 2.75 and took this up to about 14 atmospheres.  This yielded a 3.0 artery.  There was marked improvement in the appearance of the artery without evidence of significant edge dissection.  The patient was then taken to the holding area in satisfactory clinical condition.  HEMODYNAMIC DATA:  The central aortic pressure was 184/92.  ANGIOGRAPHIC DATA:   The right coronary artery demonstrated some mild segmental disease at the ostium going outward.  There was in the first bend connecting the proximal and midvessel, there was an 80% somewhat segmental stenosis prior to stenting.  Following stenting, this was 0% residual luminal narrowing. The final result was excellent and without evidence of edge tear.  CONCLUSION:  Successful percutaneous stenting of the proximal right coronary artery as described in the above text.  DISPOSITION:  The patient will be treated with aspirin and Plavix.  Follow-up will be with [facility] and also with Gerrit Friends. Dietrich Pates, M.D. [facility] DD:  08/18/00 TD:  08/19/00 Job: [id number]

## 2011-01-16 NOTE — Op Note (Signed)
NAMEHARUKO, MERSCH               ACCOUNT NO.:  [id number]   MEDICAL RECORD NO.:  [id number]          PATIENT TYPE:  AMB   LOCATION:  DSC                          FACILITY:  [facility]   PHYSICIAN:  Matthew A. Weingold, M.D.DATE OF BIRTH:  06-30-1934   DATE OF PROCEDURE:  05/13/2005  DATE OF DISCHARGE:                                 OPERATIVE REPORT   PREOPERATIVE DIAGNOSIS:  Left index and left ring stenosing tenosynovitis.   POSTOPERATIVE DIAGNOSIS:  Left index and left ring stenosing tenosynovitis.   PROCEDURE:  A1 pulley release of left index and left ring.   SURGEON:  Artist Pais. Mina Marble, M.D.   ASSISTANT:  Aura Fey. Bobbe Medico.   ANESTHESIA:  Regional.   TOURNIQUET TIME:  25 minutes.   COMPLICATIONS:  None.   DRAINS:  None.   DESCRIPTION OF PROCEDURE:  Patient was taken to the operating room after the  induction of adequate regional anesthetic and mild sedation.  Left upper  extremity is prepped and draped in the usual sterile fashion.  Once this was  done, transverse incision made over the A1 pulley of the index and ring  fingers.  Neurovascular bundles were identified and retracted.  The A1  pulley was identified and split with a 15 blade.  The FDS and FDB tendons  were lysed of all adhesions.  The wounds were thoroughly irrigated and they  were both closed loosely with 5-0 nylon.  A sterile dressing of Xeroform,  4x4s and compression wrap was applied.  The patient tolerated the procedure  well and went to the recovery room in stable condition.      Artist Pais Mina Marble, M.D.  Electronically Signed     MAW/MEDQ  D:  05/13/2005  T:  05/13/2005  Job:  [id number]

## 2011-01-16 NOTE — H&P (Signed)
NAMEMARLAYSIA, [name] NO.:  [id number]   MEDICAL RECORD NO.:  [id number]                   PATIENT TYPE:  EMS   LOCATION:  ED                                   FACILITY:  [facility]   PHYSICIAN:  Duke Salvia, M.D. [facility]           DATE OF BIRTH:  11-May-1934   DATE OF ADMISSION:  10/19/2002  DATE OF DISCHARGE:                                HISTORY & PHYSICAL   CHIEF COMPLAINT:  The patient is seen at the request of Dr. Estell Harpin in the  [facility] Emergency Room because of shortness of breath and a borderline  positive troponin.   HISTORY OF PRESENT ILLNESS:  She is a 77 year old lady with severe chronic  obstructive pulmonary disease with an eight day history of dyspnea.  She was  seen in the [facility] Emergency Room a week ago where she was felt  to have bronchitis/heart failure because of an elevated BNP.  She was  diuresed and was discharged to home.  It was recorded at that time that her  sodium was 124.  Her troponin's were normal.   She has been persistently dyspneic through the week.  She has not had fever.  She has had sputum productive of a non-colored phlegm.  She is not aware of  any fever.  She also denies chest pain.  She comes in tonight because of  progressive and subacute deterioration in her shortness of breath, and is  severely dyspneic.   LABORATORY DATA:  Evaluation in the emergency room includes a blood gas on  room air with a PO2 of 55, PCO2 of 22.  Her sodium was 121.  Her troponin-I  was borderline at 0.76, with normal CK's.  Her chest x-ray was read by Dr.  Paulina Fusi as essentially clear.  She is, as noted, severely hypoxic, quite  hypertensive, and very tachycardic.   Her past cardiac history is notable for bypass down at [facility] in 1996,  with a left internal mammary artery to the left anterior descending, a vein  graft to D1, a vein graft to her obtuse marginal.  She had a PCI of her  native right when she  was cathed in 12/01, demonstrating severe native  disease, but patent grafts.  Her left ventricular function at that time was  normal.   She also has severe vascular disease with prior transient ischemic attacks  and strokes x4.  She has a small abdominal aortic aneurysm at least as of  12/01.  She is status post right carotid endarterectomy.   PAST MEDICAL HISTORY:  1. Hypertension.  2. Hyperlipidemia.  3. History of left breast biopsy.   MEDICATIONS:  1. Metoprolol.  2. Clonidine.  3. Plavix 75 mg.  4. Lopressor 50 mg b.i.d.  5. Clonidine 0.1 mg b.i.d.  6. Aspirin.  She is out of some of her medications, she just does not know  which ones.   ALLERGIES:  1. CODEINE.  2. SULFA.   HABITS:  She has not been smoking for a week.   PHYSICAL EXAMINATION:  GENERAL:  She is an elderly Caucasian female in  moderate to severe respiratory distress with a respiratory rate of about 35.  VITAL SIGNS:  Her blood pressure is 190/94, heart rate of 135.  Her O2  saturation is 92.  HEENT:  No scleral icterus, no xanthomata.  NECK:  Her neck veins are at the angle of the jaw sitting upright.  LUNGS:  She has bibasilar crackles.  HEART:  Her heart sounds are distant and very tachycardic.  ABDOMEN:  Soft with active bowel sounds.  Modestly distended.  There is no  discomfort.  Distal pulses were not palpable.  EXTREMITIES:  There was no peripheral edema, but there was presacral edema.  NEUROLOGIC:  Grossly normal.   LABORATORY DATA:  Notable for a Wolken blood cell count of 14.5, hematocrit  that was somewhat low at 35, this is definitely given to her degree of  respiratory disease.  Her sodium was 121, bicarbonate 19.  Sugar was 136.  BUN and creatinine were normal.  CK was normal, troponin-I was 0.76.  Electrocardiogram demonstrated poor R-wave progression across the anterior  precordium.  There is some mild ST segment flattening, but no other acute  biatrial enlargement.   IMPRESSION:   1. Severe respiratory distress progressive over the last week with severe     hypoxia, normal chest x-ray, elevated BNP a week ago.  2. Severe chronic obstructive pulmonary disease with ongoing cigarette abuse     (until a week ago).  3. Coronary artery disease.  Status post coronary artery bypass grafting in     1996, with a left internal mammary artery to the left anterior     descending, a vein graft to the D1, a vein graft to the OM, and a PCI of     the right coronary artery in 12/01.  Ejection fraction normal.  Troponin-     I borderline positive.  4. Peripheral vascular disease.  Status post transient ischemic     attacks/cerebrovascular accidents x4.  Abdominal aortic aneurysm.  Status     post right carotid endarterectomy.  5. Hypertension.  6. Hyperlipidemia.  7. Hyponatremia, probably dilutional.  8. Sinus tachycardia.  9. Elevated Priola count.   The patient is severely dyspneic with impending respiratory failure.  I am  not sure why she is so dyspneic.  As noted, her chest x-ray is unimpressive  and is not likely to explain the severe nature of her hypoxia.  Her BNP was  elevated last week, so I suspect congestive failure is playing at least a  part of this;  I wonder whether her chronic obstructive pulmonary disease  may be able to mask heart failure.   Her left ventricular function was previously normal, though she apparently  has been drinking not insignificantly, and one wonders whether there may not  be a component of left ventricular dysfunction here;  however, her chest x-  ray does not show a significantly enlarged heart.  Her troponins are just  borderline positive and not sufficient to explain this in my mind, though it  may be secondary to ongoing respiratory distress and/or pulmonary embolism  which would be a relatively good unifying diagnosis.   RECOMMENDATIONS:  1. Use heparin.  2. Nitroglycerin for preload reduction as well as diuresis. 3. Transfer to  [facility].  4. BiPAP  for respiratory support.  5. Critical care medicine consultation.  6. Serial enzymes.  7. We will defer antibiotics to critical care.                                                 Duke Salvia, M.D. [facility]    SCK/MEDQ  D:  10/19/2002  T:  10/19/2002  Job:  [id number]   cc:   [facility]

## 2011-01-16 NOTE — Cardiovascular Report (Signed)
NAMEALLIENE, [name]                           ACCOUNT NO.:  [id number]   MEDICAL RECORD NO.:  [id number]                   PATIENT TYPE:  INP   LOCATION:  6525                                 FACILITY:  [facility]   PHYSICIAN:  [name], M.D. [facility]         DATE OF BIRTH:  Mar 02, 1934   DATE OF PROCEDURE:  10/20/2002  DATE OF DISCHARGE:  10/24/2002                              CARDIAC CATHETERIZATION   PROCEDURES:  Left heart catheterization, left ventriculography, coronary  angiography, saphenous vein graft angiography x2, selective left subclavian  angiography, selective left internal mammary artery angiography, arch  aortography, abdominal aortography without runoff.   INDICATIONS:  The patient is a 77 year old lady with coronary artery  disease, status post CABG in 1996 (LIMA to LAD, SVG to diagonal, SVG to OM),  as well as stenting of the RCA in December of 2001.  She is now admitted  with a three-week history of progressive dyspnea culminating in severe  congestive heart failure.  Troponins modestly elevated at 0.76.  Given her  history of coronary artery disease and now progressive congestive failure,  we will proceed with diagnostic angiography.  With congestive heart failure  despite preserved systolic function and her substantial peripheral and  coronary vascular disease, we will also perform abdominal aortography to  exclude renal artery stenosis.   DIAGNOSTIC TECHNIQUE:  Informed consent was obtained. Under 1% lidocaine  local anesthesia, a 6 French sheath was placed in the right femoral artery  and 8 French sheath in the right femoral vein using the modified Seldinger  technique.  Right heart catheterization was performed using a balloon-tipped  catheter.  Cardiac output was measured by thermodilution.  Coronary  angiography was performed using JL4 and JR4 catheters for the native  coronaries, a JR4 catheter was selectively engaged to the saphenous vein  graft to the  diagonal and a saphenous vein graft to the obtuse marginal.  There was difficulty engaging the left subclavian artery.  Therefore, to  localize it, arch aortography was performed via a pigtail catheter. The  pigtail catheter was then advanced into the left ventricle and  ventriculography performed in the RAO projection.  The JR4 catheter was re-  introduced over a wire and selectively engaged in the left subclavian  artery.  Subclavian angiography was performed by hand injection. The  catheter was then exchanged over a wire for a LIMA catheter which was  selectively engaged in the left internal mammary artery graft.  Finally, the  pigtail catheter was re-introduced and suprarenal abdominal aortography was  performed by power injection.  The patient tolerated the procedure well and  was transferred to the holding room in stable condition.   COMPLICATIONS:  None.   FINDINGS:  Hemodynamics:  RA 12/10/9, RV 35/7/9, PA 35/21/25, PCW 17/16/15.  No mitral stenosis or aortic stenosis.   VENTRICULOGRAPHY:  EF 50% with distal anterolateral akinesis, apical  aneurysm, and severe hypokinesis of the  distal inferior wall.  Mitral  regurgitation of 1+.   CORONARY ANGIOGRAPHY:  1. Left main:  An 80% ostial stenosis.  2. LAD:  The LAD is a large vessel giving rise to two diagonal branches, the     first of which is moderate in size, and the second which is large.  The     LAD is occluded after the takeoff of the first diagonal branch.  He has a     widely patent LIMA to mid LAD graft.  This supplies the mid and distal     LAD as well as the large second diagonal branch.  There is no significant     disease in this territory.  The small first diagonal branch supplied via     native left circulation has a 70% proximal stenosis.  3. Circumflex:  The circumflex is occluded proximally.  There is a patent     saphenous vein graft to first obtuse marginal branch.  This marginal     branch is a small vessel  which has a 90% stenosis just distal to the     touchdown of the vein graft.  There is retrograde provision of the AV     groove circumflex via the graft.  4. RCA:  The RCA is a moderate sized, dominant vessel.  The stent in the     proximal vessel is widely patent.  However, there is 70% ostial stenosis     associated with substantial catheter damping.  There is a 90% stenosis at     the mid vessel just after the takeoff of a large acute marginal branch.     This acute marginal has a 90% stenosis in its midsection, lying at AV     bifurcation.  5. Occluded saphenous vein graft to diagonal.  6. Left subclavian artery is patent.   ABDOMINAL AORTOGRAPHY:  There are single renal arteries bilaterally.  There  is a 60% stenosis of the right renal artery and a 70% stenosis of the left  renal artery.   IMPRESSION:  1. Mildly impaired left ventricular systolic function.  2. Mildly elevated right heart filling pressures and pulmonary capillary     wedge pressure.  3. No significant valvular disease.  4. Severe stenosis above the right coronary artery ostium in the mid right     coronary artery.  5. Severe ostial left main stenosis.  However, this vessel supplies only a     modest size diagonal which itself is diseased.  6. Moderate bilateral renal artery stenosis.   PLAN:  Plan will be for percutaneous coronary interventions of both RCA  lesions after the patient is allowed to have her congestive heart failure  further improved, so she will be able to further tolerate an addition of dye  load. In addition, she needs to have her potassium repleted.   Renal artery stenoses are moderate bilaterally.  I suspect that her  congestive failure is more related to her coronary disease than to her renal  artery stenosis.  We will therefore plan a strategy of intervening upon the  coronary disease and following her clinically.  Should she have recurrent congestive failure.  and she is unable to be  managed  medically, consideration can be given to renal stenting. She should be  followed with yearly abdominal ultrasounds to exclude progressive renal  atrophy, which would be an indication for percutaneous renal intervention.  In addition, creatinine should be followed at least every six months.  [name], M.D. [facility]    WED/MEDQ  D:  11/09/2002  T:  11/09/2002  Job:  [id number]   cc:   Tresa Endo L. Philipp Deputy, M.D.  [phone] [address]  [place] [address]  Fax: [phone]

## 2011-01-16 NOTE — Assessment & Plan Note (Signed)
[facility]                            CARDIOLOGY OFFICE NOTE   NAME:Carreon, MEADOW ABRAMO                      MRN:          [id number]  DATE:12/30/2006                            DOB:          1933/12/15    PRIMARY CARE PHYSICIAN:  Dario Guardian, M.D.   HISTORY OF PRESENT ILLNESS:  Ms. Seltzer is a 77 year old lady with  atherosclerotic coronary disease. She is status post CABG in 1996 and  has had multiple subsequent percutaneous coronary interventions. Her  ejection fraction is between 35% and 45%. She has moderate mitral  regurgitation and mild aortic insufficiency. In October and November she  had profound orthopnea leading her to sleep in a chair for the better  part of two months. She did not seek any medical attention for this, but  eventually doubled her Lasix on her own. With that, she has returned to  an asymptomatic status and is now able to sleep through the night in her  own bed with one pillow. She has not had any chest discomfort, PND or  orthopnea or edema.   CURRENT MEDICATIONS:  1. Lipitor 80 mg daily.  2. Iron 325 mg three times daily.  3. Aspirin 81 mg daily.  4. Zetia 10 mg daily.  5. Imdur 30 mg daily.  6. Bisoprolol 10 mg daily.  7. Enalapril 10 mg twice daily.  8. Lasix 80 mg daily.  9. Clonidine 1 mg twice daily.   PHYSICAL EXAMINATION:  She is generally well-appearing in no distress.  Heart rate 80, blood pressure 140/78, weight 109 pounds.  She has no jugular venous distention, thyromegaly or lymphadenopathy.  LUNGS:  Clear to auscultation. Respiratory effort is normal.  She has a nondisplaced point of maximal cardiac impulse. There is a  regular rate and rhythm without rub or gallop. There is a 2/6  holosystolic murmur at the apex.  ABDOMEN: Soft, nondistended and nontender. There is no  hepatosplenomegaly and no pulsatile midline mass. Bowel sounds are  normal.  EXTREMITIES: Are warm and without edema, clubbing,  edema or ulceration.  Carotid pulses are 2+ bilaterally without bruit.   IMPRESSIONS/RECOMMENDATIONS:  1. Coronary disease: Stable with EF of 30-35% and prior inferior      infarct. Continue aspirin, ACE inhibitor and beta-blocker.  2. Systolic heart failure: Asymptomatic now. Continue bisoprolol,      enalapril and Lasix.  3. Hypercholesterolemia: Continue Lipitor and Zetia.  4. Abdominal aortic and iliac aneurysms: Followup by Dr.  Woodfin Ganja.  5. Moderate renal artery stenosis: Continue medical therapy.  6. Tobacco abuse: Congratulated her on remaining off of it.  7. Hypertension: Blood pressure up a bit today, but was much better      just a few weeks ago. Will not change her medications at present.  8. Chronic obstructive pulmonary disease: Asymptomatic at present.  9. Peptic ulcer disease: Asymptomatic.  10.Will have her back in three months time to see Dr.  Antoine Poche.     [name], MD  Electronically Signed    WED/MedQ  DD: 12/30/2006  DT: 12/30/2006  Job #: [id number]  cc:   Judithann Sauger, M.D.

## 2011-01-16 NOTE — Op Note (Signed)
[name], [name]               ACCOUNT NO.:  [id number]   MEDICAL RECORD NO.:  [id number]          PATIENT TYPE:  INP   LOCATION:  1699                         [facility]ACILITY[facility]   PHYSICIAN:  Angelia Mould. Derrell Lolling, M.D.DATE OF BIRTH:  07/11/1934   DATE OF PROCEDURE:  03/30/2006  DATE OF DISCHARGE:                                 OPERATIVE REPORT   PREOPERATIVE DIAGNOSIS:  Gallstone pancreatitis, acute and chronic  cholecystitis with cholelithiasis.   POSTOPERATIVE DIAGNOSIS:  Gallstone pancreatitis, acute and chronic  cholecystitis with cholelithiasis.   OPERATION PERFORMED:  Laparoscopic cholecystectomy with intraoperative  cholangiogram.   SURGEON:  Angelia Mould. Derrell Lolling, M.D.   FIRST ASSISTANT:  Gita Kudo, M.D.   OPERATIVE INDICATIONS:  This is a 77 year old [place] female who presented to  the emergency department on March 27, 2006 with sudden onset of abdominal  pain, nausea and vomiting.  She had had a previous episode 3 weeks prior.  She denied any history of jaundice or acholic stools.  She denied fever or  chills.  Her family reports that she does drink alcohol frequently.  The CT  scan of the pelvis showed a thick-walled gallbladder with pericholecystic  fluid and gallstones consistent with acute cholecystitis.  Lab work showed  an amylase of over 4000 and a lipase of over 4000 and mildly elevated liver  function tests.  She was admitted by the [facility] hospitalist, placed on  intravenous antibiotics and bowel rest.  She improved in the past few days  with near resolution of her abdominal pain and returned appetite.  She was  seen by Dr. Willa Rough of [facility] who has been following her  because of her coronary artery disease and has been prescribing her  medications and beta blockers.  He cleared her for surgery and she is  brought to operating room today for cholecystectomy.   OPERATIVE FINDINGS:  The gallbladder was thick-walled and acutely and  chronically inflamed.  The anatomy of the cystic duct, cystic artery and  common bile duct were conventional.  The cholangiogram looked normal  demonstrating normal intrahepatic and extrahepatic biliary anatomy, no  filling defect, and no obstruction with good flow of contrast into duodenum.  The pancreatic duct filled somewhat as well although this was a low pressure  injection.  The liver, stomach, duodenum, small intestine, large intestine  looked grossly normal to inspection.  There was no ascites.   OPERATIVE TECHNIQUE:  Following induction of general endotracheal anesthesia  the patient's abdomen was prepped and draped in sterile fashion.  0.5%  Marcaine with epinephrine was used as local infiltration anesthetic.  Vertically oriented incision was made in the lower rim of the umbilicus.  The fascia was incised in the midline and the abdominal cavity entered under  direct vision.  A 10 mm Hasson trocar was inserted and secured the  pursestring suture of 0 Vicryl.  Pneumoperitoneum was created.  The video  camera was inserted with visualization and findings as described above.  A  10-mm trocar was placed in the subxiphoid region and two 5 mm trocars placed  in the  right midabdomen.  The gallbladder fundus was elevated.  There were a  lot of adhesions on the body and infundibulum of the gallbladder which were  carefully sequentially taken down until we had clear identification of this  infundibulum of the gallbladder.  We carefully dissected out the cystic duct  and the cystic artery and separated from each other.  The cystic artery was  isolated, secured with multiple metal clips and divided just below where it  went onto the wall of the gallbladder.  This created a nice window behind  the cystic duct.  A metal clip was placed on the cystic duct close to the  gallbladder.  The cystic duct was opened and a cholangiogram catheter  inserted.  The cholangiogram was performed using the C-arm  and the  cholangiogram was normal as described above.  The cholangiogram catheter was  removed and cystic duct secured with multiple metal clips, divided.  The  gallbladder dissected from its bed with electrocautery placed in the  specimen bag and removed.  We had to spend a fair amount of time irrigating  out the operative field.  Hemostasis in the gallbladder bed required some  cautery because of the inflammation but ultimately we felt like we had good  hemostasis.  We irrigated some more and got all of the irrigation fluid  until it was clear both above and below the liver.  We placed a piece of  Surgicel gauze the bed of the gallbladder but there is no bile or bleeding  at that time.  We looked around the areas of dissection and saw no other  abnormalities.  The trocars were removed under direct vision.  There is no  bleeding from trocar sites.  Pneumoperitoneum was released.  The fascia at  the umbilicus was closed with 0 Vicryl sutures and skin closed with  subcuticular sutures of 4-0 Monocryl and Steri-Strips.  Clean bandages were  placed and the patient taken to recovery room in stable condition.  Estimated blood loss was about 25-35 mL.  Complications none. Sponge, needle  and instrument counts were correct.      Angelia Mould. Derrell Lolling, M.D.  Electronically Signed     HMI/MEDQ  D:  03/30/2006  T:  03/31/2006  Job:  [id number]   cc:   Willa Rough, M.D.  [address]  [address]  [place]  [place] [address]   [name], M.D. [facility]  [address]  [address]  [place]  [place] [address]   Dario Guardian, M.D.  Fax: [phone]

## 2011-01-16 NOTE — Discharge Summary (Signed)
[place]  Patient:    [name], [name]                        MRN: [id number] Adm. Date:  [id number] Disc. Date: 11/10/99 Attending:  Baldo Daub CC:         [facility] physicians                           Discharge Summary  DISCHARGE DIAGNOSES: 1. Transient ischemic attack. 2. Hypertension. 3. Coronary artery disease. 4. Tobacco dependence.  PROCEDURE/INTERVENTIONS: 1. MRI/MRA of the brain. 2. A 2-D echocardiogram.  HISTORY OF PRESENT ILLNESS:  This patient is a 77 year old woman who presented o the emergency room after less than a one-minute episode of right-sided weakness and numbness involving the right upper and right lower extremity equally.  She denies speech problems or visual changes.  HOSPITAL COURSE:  Upon admission her neurological examination was essentially normal.  Her symptoms had resolved.  She was admitted to the hospital for a further workup and testing, including an MRI/MRA of the brain, which showed atherosclerotic disease intracranially without any significant hemodynamic stenosis.  The right  internal carotid artery showed a lesion suspicious for an ulcerated plaque, without significant stenosis.  A 2-D echocardiogram was completely normal.  The patient was treated with hemodynamic support and IV fluids, heparin stroke protocol.  Her neurological status remained stable throughout the hospital stay.  The examination remained essentially normal.  DISPOSITION:  After reviewing her testing, her long-term secondary stroke prevention treatment was switched from aspirin to Plavix 75 mg once a day, and he was discharged home in a stable condition.  INSTRUCTIONS:  The patient was strongly advised to quit smoking.  The patient is to call her primary care physician at [facility] for followup in two to three weeks.  DD:  11/10/99 TD:  11/10/99 Job: [id number]

## 2011-01-16 NOTE — Discharge Summary (Signed)
[name], [name]                           ACCOUNT NO.:  [id number]   MEDICAL RECORD NO.:  [id number]                   PATIENT TYPE:  INP   LOCATION:  2034                                 FACILITY:  [facility]   PHYSICIAN:  Olga Millers, M.D. [facility]            DATE OF BIRTH:  [birth year]   DATE OF ADMISSION:  02/01/2004  DATE OF DISCHARGE:  02/13/2004                                 DISCHARGE SUMMARY   BRIEF HISTORY:  This is a 77 year old female followed in our practice by  [name], M.D.  She has a history of coronary artery disease.  She  is status post coronary artery bypass graft surgery in 1996.  She is also  status post percutaneous intervention of the RCA in the past.  She presented  to [facility] on January 31, 2004, for evaluation of nausea, dizziness,  and presyncope.  She ruled in for a non-ST elevation MI.   PAST MEDICAL HISTORY:  Notable for:  1. Coronary artery disease with coronary artery bypass graft surgery in     1996.  [place].  She had a LIMA     to the LAD and a vein graft to the diagonal.  She had a non-ST elevation     MI in February of 2004 and had percutaneous intervention of the right     coronary artery at that time.  2. History of dyslipidemia.  3. Moderate renal artery stenosis treated medically.  4. Hypertension.  5. COPD.  6. Arteriosclerotic peripheral vascuilar disease.  7. Status post right carotid endarterectomy.  8. History of TIAs and CVAs.  9. History of abdominal aortic aneurysm that is being followed.  10.      She has well-preserved LV function.   ALLERGIES:  CODEINE and SULFA, both of which cause nausea.   SOCIAL HISTORY:  [place]  She is  originally from [place].  She is single.  She smokes one packs of  cigarettes per day.  She uses alcohol occasionally.   FAMILY HISTORY:  The patient's mother died at age 51 from childbirth.  Her  father  died at age 46 from a CVA and pulmonary embolus.  She has no  siblings.   HOSPITAL COURSE:  As noted, this patient was admitted on [facility]  on November 01, 2003, with nausea, dizziness, and presyncope.  She ruled in for  a non-ST elevation MI.  The patient's hospital course was complicated by a  vulvar cellulitis.  She had a GYN consultation from Dr. Sydnee Cabal.  She was  also seen by infectious disease, specifically Dr. Ninetta Lights.  The patient was  treated with antibiotics and sitz baths for her cellulitis.  She was found  to be positive for MRSA.   Initially the plans were for cardiac catheterization.  However, the  patient's cellulitis involved the  area where the catheterization would be  performed.  Therefore, the catheterization was delayed.  She had a  Cardiolite stress test on February 06, 2004, that revealed no definite ischemia.  She did have an ejection fraction of only 35% with global hypokinesis.   On February 06, 2004, the patient developed acute congestive heart failure.  She  was diuresed and improved.  The plans were for her to have a 2-D  echocardiogram approximately 12 weeks after discharge in the [place] office.  Her hospital course was also complicated by nonsustained VT.  If her  echocardiogram in approximately 12 weeks shows a low ejection fraction, she  may need an EP evaluation as well.   The patient was found to have heme-positive stool and anemia.  She did  require transfusion.  A GI consult was obtained on February 04, 2004, from Dr.  Marina Goodell.  The patient eventually had an upper endoscopy performed that showed  an acute gastric ulcer with no hemorrhage.  It was recommended that she  avoid nonsteroidal anti-inflammatory drugs.  She was treated with an  intravenous proton pump inhibitor and later p.o. Protonix.  It was  recommended that she have a relook EGD in approximately eight weeks to be  performed by Dr. Marina Goodell.   The patient does have a history of tobacco use.  A  smoking cessation consult  was obtained during her stay.   The patient was followed carefully by GYN and infectious disease for her  cellulitis throughout her hospital stay.  Eventually it was felt that she  could be discharged on February 13, 2004, in improved condition.  It was  recommended that she continue on a 10-day course of doxycycline after  discharge.  She was discharged in stable and improved condition.   LABORATORY DATA:  A chemistry profile on February 12, 2004, revealed BUN 10,  creatinine 1.0, potassium 3.8, and glucose 124.  A magnesium level on February 10, 2004, was 1.9.  A troponin on February 06, 2004, was 1.05.  The previous  troponin was 1.40.  A BNP on February 06, 2004, was 1680.  CK-MB enzymes on February 06, 2004, revealed a CK of 37 and an MB of 3.  A lipid profile on February 06, 2004, revealed cholesterol 101, triglycerides 84, HDL 37, and LDL 47.  Please see results of Cardiolite as noted above.  A CT scan of the chest  performed on February 02, 2004, was negative for pulmonary embolus and negative  for thoracic dissection.  A chest x-ray on February 01, 2004, showed mild  cardiomegaly, status post CABG, but no acute disease.  An abdominal CT  performed on February 02, 2004, showed a 3.7 cm AAA.   DISCHARGE MEDICATIONS:  1. Doxycycline 100 mg one b.i.d. for 10 days.  2. Plavix 75 mg daily.  3. Lipitor 80 mg daily.  4. Niaspan 100 mg at bedtime.  5. Enalapril 10 mg twice daily.  6. Imdur 30 mg daily.  7. Albuterol inhaler two puffs q.6h. p.r.n.  8. Iron 325 mg t.i.d.  9. Protonix 40 mg b.i.d.  10.      Metoprolol 100 mg b.i.d.  11.      Aldactone 25 mg daily.  12.      Lasix 40 mg daily.  13.      K-Dur 20 mEq daily.  14.      Hydrocodone as needed for pain as previously taken.   ACTIVITY:  The patient was told  to avoid any strenuous activity.   DIET:  She was to be on a low-salt, low-fat diet.  WOUND CARE:  [facility] would be seeing her for her wound care at  home.  She is to  have BMET blood test drawn by home health on February 18, 2004.   FOLLOWUP:  The patient will see Dr. Melinda Crutch physician's assistant on March 04, 2004, at noon.  She was to call [facility] for a followup appointment.  She will see Dr. Sydnee Cabal on a p.r.n. basis.  She was given his phone  number, [phone], in case she needed to contact him.  Also, Dr. Ninetta Lights from  infectious disease, [phone], as needed.  The patient will see Wilhemina Bonito.  Marina Goodell, M.D., on Monday, March 24, 2004, at 10:15 a.m. for further followup  regarding her ulcer.   PROBLEM LIST AT THE TIME OF DISCHARGE:  1. Non-Q-wave myocardial infarction.  2. Vulvar cellulitis positive for methicillin-resistant Staphylococcus     aureus, requiring infectious disease and gynecological consults.  3. Anemia with heme-positive stool, requiring transfusions and     gastroenterology consult with upper endoscopy revealing acute gastric     ulcer without hemorrhage with relook recommended in eight weeks per Dr.     Marina Goodell along with proton pump inhibitor therapy.  4. Nonsustained ventricular tachycardia this admission with plans for a     repeat echocardiogram in 12 weeks and possible electrophysiology     evaluation.  5. History of coronary artery bypass graft surgery in 1996.  6. Status post percutaneous intervention of the right coronary artery in     February of 2004.  7. Elevated lipids.  8. History of renal artery stenosis.  9. History of hypertension.  10.      History of chronic obstructive pulmonary disease.  11.      Known abdominal aortic aneurysm with abdominal CT this admission     showing the size to be 3.7 cm.  12.      Chest CT this admission ruling pulmonary embolus and thoracic     dissection.  13.      History of tobacco use with smoking cessation consult this     admission.  14.      History of transient ischemic attacks and cerebrovascular     accidents.  15.      Congestive heart failure this admission.  16.       Adenosine Cardiolite performed February 06, 2004, revealing no definite     ischemia and global hypokinesis with an ejection fraction of 35%.   As noted, the plan will be to repeat her echocardiogram in 12 weeks to  evaluate her ejection fraction for possible EP evaluation.      Delton See, P.A. [facility]                  Olga Millers, M.D. [facility]    DR/MEDQ  D:  02/13/2004  T:  02/13/2004  Job:  [id number]   cc:   Lacretia Leigh. Ninetta Lights, M.D.  [address]  [place]  [place] [address]  Fax: [phone]   Bing Neighbors. Sydnee Cabal, MD  Fax: [phone]   [facility]   Wilhemina Bonito. Marina Goodell, M.D. [facility]

## 2011-01-16 NOTE — H&P (Signed)
[name], [name]                           ACCOUNT NO.:  [id number]   MEDICAL RECORD NO.:  [id number]                   PATIENT TYPE:  INP   LOCATION:  ED                                   FACILITY:  [name]   PHYSICIAN:  [name], M.D.                   DATE OF BIRTH:  [birth year]   DATE OF ADMISSION:  02/01/2004  DATE OF DISCHARGE:                                HISTORY & PHYSICAL   CHIEF COMPLAINT:  Vaginal itching.   HISTORY:  [name] is a 77 year old [name] female with a known history of  coronary artery disease status post surgical and percutaneous  revascularization who presents with a 1-week history of vaginal itching.  She notes irritation in her groin after dismantling a shed.  She  subsequently did scratch it some.  It became erythematous and edematous and  radiated into the perivulvar region.  Then she had such significant  discomfort she presented to the [name].  The patient  also describes a 37-month history of intermittent episodes of nausea and  lightheadedness.  These occur at any time and she is not able to describe  any eliciting or alleviating factors.  She does lie down when this happens.  She has not had any abdominal cramps with this or vomiting.  No diarrhea,  bright red blood per rectum, melena, hematuria, or dysuria.  She has not had  any chest pain or shortness of breath either.  These are transient in  nature.  She has had a couple of episodes where she has been lightheaded and  one episode where she fell down but did not have frank loss of  consciousness.  These have been increasing in frequency to where they occur  almost daily.  Since yesterday morning, she has had persistence of nausea  and lightheadedness and today in [name] she is found to  be tachycardic and hypertensive with abnormal ECG and cardiac enzymes.  We  are asked to evaluate her.  Currently, she feels slightly nauseous but is  overall better.   She denies any shortness of breath or chest pain.  No  lightheadedness.  She was given some Benadryl for her vaginal rash and has  noted significant improvement in symptoms.  She has not had any fevers or  chills and review of systems are otherwise noncontributory.   PAST MEDICAL HISTORY:  Notable for coronary artery disease status post  coronary artery bypass graft surgery in 1996 at [facility] with LIMA to the  LAD and vein graft to the diagonal branch.  She has had non-ST-elevation  myocardial infarction in February of 2004 and has had several percutaneous  interventions to the right coronary artery, latest in February 2004.  She  has dyslipidemia, moderate renal artery stenosis that is being medically  managed.  Hypertension, obstructive lung disease, peripheral vascular  disease status post  right carotid endarterectomy.  She has had transient  ischemic attacks and strokes x4.  She has abdominal aortic aneurysm that is  being followed.  She has well preserved LV function.   SOCIAL HISTORY:  The patient lives in [name].  She is originally from  [name].  She is single at this point.  She smokes 1 pack per day for most  of her adult life.  Denies excessive alcohol use and denies recreational  drug use.   ALLERGIES:  CODEINE and SULFA which cause nausea.   CURRENT MEDICATIONS:  1. Lipitor 80 mg per day.  2. Niaspan 100 mg q.h.s.  3. Clonidine 0.1 mg b.i.d.  4. Zetia 10 mg daily.  5. Hydrochlorothiazide 12.5 mg daily.  6. Plavix 75 mg daily.  7. Allegra 180 mg daily.  8. Metoprolol 50 mg b.i.d.  9. Hydrocodone p.r.n.  10.      Enalapril 20 mg daily.   FAMILY HISTORY:  The patient's mother died at the age of 23 of childbirth.  Father died at age 96 with stroke and pulmonary embolus.  She has no  siblings.   PHYSICAL EXAMINATION:  GENERAL:  She is a well-developed, well-nourished  [name] female in no acute distress.  VITAL SIGNS:  She is afebrile.  Pulse 105 and regular,  respirations 20,  blood pressure 125/77.  O2 saturation is 100% on 2 liters nasal cannula.  HEENT:  Pupils equal, round, reactive to light.  Extraocular muscles are  intact.  Oropharynx with no lesions.  NECK:  Supple.  She is status post right carotid endarterectomy.  No bruits,  no adenopathy.  HEART:  Regular rate.  There is a 2/6 systolic ejection murmur at the base.  LUNGS:  Diminished breath sounds bilaterally.  No wheezing, no rales.  ABDOMEN:  Distended but nontender.  EXTREMITIES:  No peripheral edema.  Peripheral pulses are diminished  bilaterally.  GENITAL:  Edema and erythema in the perivaginal region with mild scaling but  no ulceration.  NEUROLOGICAL:  Motor strength is 5/5, sensory is intact to touch.  Cranial  nerves II-XII are intact.   Chest x-ray shows mild cardiac enlargement.  There appears to be mild  mediastinal widening but no acute infiltrate or effusions.  ECG is sinus  tachycardia at 148 beats per minute.  There is a left axis deviation  suggestive of old inferior MI as well as poor R-wave progression suggesting  old interseptal MI.  There is slight ST elevation in leads V2 only.   LABORATORY DATA:  Woessner count is 12.5, hemoglobin is 1.1, hematocrit 33.3,  platelets 402.  Sodium 133, potassium 3.4, chloride 99, bicarb 24, BUN 11,  creatinine 1.9, glucose 108.  CK-MB is 5.4, troponin-I is 0.20.   ASSESSMENT AND PLAN:  [name] is an 77 year old female with vaginal  itching and rash of unclear etiology.  The patient has received Benadryl  with significant improvement.  We will plan on initiating on antibiotics for  possible cellulitis.  This may represent a herpetic lesion.  We will ask our  gynecologist colleagues to assess and recommend treatment options.  The  patient also seems to have suffered an out-of-hospital myocardial  infarction.  She did not have extreme changes on her ECG and is not hemodynamically stable.  At this point, I recommend admission  to the  hospital for medical stabilization including controlled hemodynamics with  antianginals and the advancement of her beta blocker.  We will follow her  enzymes should she become  unstable after recurrence of symptoms and  consideration will be given towards urgent catheterization.  However, we  would be more prudent to  stage her catheterization for the early part of next week to give her  vaginal infection some time to improve.  We will also obtain a chest CT to  assess the patient's mediastinal widening.  She has had a history of  abdominal aneurysm and may in fact have a thoracic one as well.                                               [name], M.D.    Melton Alar  D:  02/01/2004  T:  02/01/2004  Job:  [id number]   cc:   [name], M.D. [facility]  [address]  [address]  [place]  [place] [address]   Dr. Georjean Mode  [facility]

## 2011-01-16 NOTE — Discharge Summary (Signed)
[place]. [facility]  Patient:    [name], [name]                        MRN: [id number] Adm. Date:  [id number] Disc. Date: 08/19/00 Attending:  Nelta Numbers Dictator:   Tereso Newcomer, P.A. CC:         Dr. Silvana Newness, [facility]  Gerrit Friends. Dietrich Pates, M.D. [facility]   Discharge Summary  DATE OF BIRTH: 1934/03/26  REASON FOR ADMISSION: Chest pain.  DISCHARGE DIAGNOSES:  1. Coronary artery disease.  2. Status post coronary artery bypass grafting at [facility] in [place] in January 1996.  3. Hyperlipidemia.  4. Hypertension.  5. Peripheral vascular disease.  6. Status post transient ischemic attack x 4.  7. Status post right carotid endarterectomy in 1998.  8. Tobacco abuse.  9. Small distal abdominal aortic saccular aneurysm measuring 2.8 cm in     maximum dimension.  PROCEDURES:  1. Cardiac catheterization performed by Dr. Graceann Congress on August 18, 2000, revealing left main 20%, distal LAD total, circumflex total, with     ostial stenosis; RCA 80% segmental proximal; saphenous vein graft to     diagonal unable to be visualized; saphenous vein graft to obtuse marginal     patent with 50% stenosis, left internal mammary artery to left anterior     descending patent with 1-2+ AR.  2. Status post percutaneous transluminal coronary angioplasty and stent     placement in the right coronary artery by Dr. Shawnie Pons in August 18, 2000, with reduction of stenosis from 80% to 0%.  3. Carotid Doppler ultrasound for bruit noted on physical examination,     revealing no internal carotid artery stenosis.  4. Abdominal ultrasound - impression as read by the radiologist, Dr.     Vear Clock, shows probable fundal cholesterol polyp, tiny floating     echogenicities consistent with cholesterol crystals with light     pericholecystic fluid and absence of generalized ascites - cholecystitis     is  likely; biliary scintigraphy may confirm.  Small distal abdominal     aortic saccular aneurysm measuring to 2.7 cm in maximum dimension.     Limited visualization of the pancreas; otherwise, normal.  HISTORY OF PRESENT ILLNESS: This patient is a 77 year old [place] female with the above noted history, who was seen in the emergency room at [facility] the day of admission for complaints of chest pain.  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 She had associated nausea, diaphoresis, and dizziness.  She denied shortness of breath.  She sat down and the pain radiated into her substernal chest.  It was an aching-type pain.  She rated it as a 6/10.  She had similar symptoms one to two weeks prior but with no chest pain.  EMS was called.  Her symptoms got better with oxygen but resolved with nitroglycerin drip in the emergency room.  On initially evaluation she was pain-free.  She denied any PND, orthopnea, or exertional chest symptoms recently.  ALLERGIES:  1. CODEINE.  2. SULFA.  PHYSICAL EXAMINATION:  GENERAL: Initial physical examination revealed a well-developed, well-nourished female in no acute distress.  VITAL SIGNS: Blood pressure 147/77, heart rate 105-115, respirations 20.  NECK: Without JVD.  Positive left bruit.  No right  bruit.  CARDIAC: S1 and S2.  Regular rate and rhythm.  A 2/6 systolic murmur was noted at the lower left sternal border.  LUNGS: Clear to auscultation bilaterally.  EXTREMITIES: Without edema.  LABORATORY DATA: EKG showed a heart rate of 91, normal sinus rhythm; left axis deviation; Q waves in V1 through V3; no acute changes.  Chest x-ray showed no significant interval change.  CK 85, MB 1.3.  Troponin I 0.01.  INR 1.0.  Sodium 139, potassium 4, chloride 105, CO2 23, BUN 11, creatinine 1, glucose 123.  HOSPITAL COURSE: The patient was admitted for chest pain and was ruled out  for myocardial infarction by enzymes.  She was placed on heparin, aspirin, and nitroglycerin drip.  Lopressor 25 mg b.i.d. was added.  Carotid Doppler ultrasound was checked to assess the patients bruit, and results are noted above.  She was complaining of some abdominal pain the morning of August 17, 2000 and therefore the abdominal ultrasound was checked, with results as noted above.  The abdominal ultrasound was reviewed by Dr. Riley Kill and he noted there was no definite cholecystitis.  The patient went for cardiac catheterization on August 18, 2000, performed by Dr. Graceann Congress; results are as noted above.  She successfully underwent PCI by Dr. Riley Kill.  The morning of August 19, 2000 she was found to be in stable condition, with no recurrent of symptoms.  Dr. Riley Kill noted it was hard to know if her symptoms were ischemic and this was not definite.  She had no significant fever.  She was somewhat tachycardic and her blood pressure was somewhat elevated at 145/90.  Chest x-ray was okay.  Lopressor was stopped during her admission. Altace had been started but this was again switched to Lopressor prior to discharge due to her increased heart rate.  At the time of this dictation the patient is to be monitored for several hours today before discharge home today to make sure she will tolerate Lopressor.  DISCHARGE MEDICATIONS:  1. Lopressor 25 mg b.i.d.  2. Enteric-coated aspirin 325 mg q.d.  3. Plavix 75 mg q.d.  4. Zocor 80 mg q.h.s.  5. Clonidine 0.1 mg b.i.d.  6. Zyrtec 10 mg q.d.  7. Nitroglycerin 0.4 mg sublingual p.r.n. chest pain.  DISCHARGE ACTIVITY: No driving, heavy lifting, exertion, or sexual activity for two days.  DISCHARGE DIET: Low-fat/low-salt diet.  WOUND CARE: The patient was instructed to watch her groin for any increased swelling, bleeding, or bruising, and call the office with concerns.   FOLLOW-UP: She is to follow up with Dian Queen, P.A.C. on  September 02, 2000 at 9:15 a.m. at our office in [place], [place] and she is to call if there are any problems with her appointment.  She is to follow up with her primary care physician as-needed. DD:  08/19/00 TD:  08/19/00 Job: [id number]

## 2011-01-16 NOTE — Assessment & Plan Note (Signed)
[facility]                            CARDIOLOGY OFFICE NOTE   NAME:Lemaire, SHAQUOYA COSPER                      MRN:          [id number]  DATE:10/29/2006                            DOB:          15-Sep-1933    HISTORY OF PRESENT ILLNESS:  Ms. [name] is a 77 year old lady with  atherosclerotic coronary disease.  She is status post coronary artery  bypass grafting in 1996, and multiple subsequent percutaneous  interventions.  Ejection fraction has been 35% to 45%.  She has moderate  mitral regurgitation and mild aortic insufficiency.  In addition, she  has abdominal aortic and iliac artery aneurysms, which are followed by  Dr. Edilia Bo.   I last saw Ms. Montemayor in September.  She was doing well then.  However,  in October and November, she says that she had profound orthopnea  requiring her to sleep in a chair for the better part of 2 months.  She  initially tried to self medicate with albuterol.  She saw no impact.  She then doubled her Lasix to 80 mg per day.  With that, she returned to  an asymptomatic status.  She is now able to sleep in her bed.  She  denies any episodes of chest discomfort.  She is no longer having any  PND or orthopnea.  She has no edema now, and exertional dyspnea is at  her longstanding baseline.   She has not had any abdominal, back or leg discomfort.  She remains off  tobacco.  She has not had any symptoms concerning for TIA or stroke.   CURRENT MEDICATIONS:  1. Lipitor 80 mg daily.  2. Enalapril 10 mg twice daily.  3. Iron 325 mg 3 times per day.  4. Aspirin 81 mg per day.  5. Lasix 80 mg daily.  6. Zetia 10 mg daily.  7. Imdur 30 mg daily.  8. Bisoprolol 10 mg twice daily.  9. Clonidine 0.1 mg twice daily.  10.Metoprolol 50 mg twice daily.   PHYSICAL EXAMINATION:  She actually looks quite well.  Heart rate is 81.  Blood pressure 116/72.  Weight 109 pounds.  Weight is  up 2 pounds from September, but off 7 pounds from  March.  She has no jugular venous distention, thyromegaly, or lymphadenopathy.  LUNGS:  Clear to auscultation.  She has a nondisplaced point of maximal cardiac impulse.  There is a  regular rate and rhythm without rub or gallop.  There is a 2/6  holosystolic murmur at the apex.  ABDOMEN:  Soft, non-distended, and non-tender.  There is no  hepatosplenomegaly.  No pulsatile midline mass.  Bowel sounds are  normal.  EXTREMITIES:  Warm without clubbing, cyanosis, edema, or ulceration.  Carotid pulses are 2+ bilaterally without bruit.   IMPRESSION/RECOMMENDATION:  1. Coronary disease:  No angina.  Continue daily aspirin.  2. Systolic heart failure:  It sounds like she had pretty substantial      heart failure in October and November, which she resolved with her      increase in diuretic.  She is taking both bisoprolol and metoprolol  by accident.  I have asked her to stop the metoprolol.  We have      written this down for her.  She will continue the bisoprolol and      ACE inhibitor.  With the episode of heart failure 2 months ago, we      will leave her Lasix at 80 mg daily.  We will check TSH,      echocardiogram, BMET and CBC to look for culprits for the      exacerbation.  3. Hypercholesterolemia:  Continue Lipitor and Zetia.  4. Abdominal aortic and iliac aneurysms:  Followed by Dr. Edilia Bo.  5. Moderate renal artery stenosis:  Continue medical therapy.  6. Tobacco abuse:  Congratulated her on remaining off the tobacco.  7. Hypertension:  Nicely controlled.  8. Chronic obstructive pulmonary disease:  Asymptomatic at present.  9. Peptic ulcer disease:  Asymptomatic.   DISPOSITION:  We will see her back in 3 weeks to discuss test results.     [name], MD  Electronically Signed    WED/MedQ  DD: 10/29/2006  DT: 10/29/2006  Job #: [id number]   cc:   Dario Guardian, M.D.  Di Kindle. Edilia Bo, M.D.

## 2011-01-16 NOTE — Consult Note (Signed)
NAMEKENNEDE, LUSK               ACCOUNT NO.:  [id number]   MEDICAL RECORD NO.:  [id number]          PATIENT TYPE:  INP   LOCATION:  1606                         FACILITY:  [facility]   PHYSICIAN:  Velora Heckler, MD      DATE OF BIRTH:  1934-04-25   DATE OF CONSULTATION:  DATE OF DISCHARGE:                                   CONSULTATION   PRIMARY CARE PHYSICIAN:  Dr. Merri Brunette.   CARDIOLOGIST:  Dr. Fredrich Romans.   REASON FOR CONSULTATION:  Abdominal pain, acute cholecystitis, biliary  pancreatitis.   HISTORY OF PRESENT ILLNESS:  The patient is a 77 year old [place] female from  [place], [place], who presented to the emergency department with  sudden onset of abdominal pain, nausea, and vomiting on March 26, 2006.  The  patient had a previous such episode three weeks prior for which she did not  come to the hospital. She denies any history of jaundice or acholic stools.  She denies fevers or chills. The patient notes the pain radiated into the  back. It has improved overnight. The patient was seen in the emergency  department. Laboratory studies showed abnormal liver function test and  elevated pancreatic enzymes. CT scan of the abdomen and pelvis was obtained  which demonstrated a thick-walled gallbladder with pericholecystic fluid and  gallstones consistent with acute cholecystitis. The patient was seen and  evaluated by [facility] hospitalist and admitted on the medical service. General  surgery is now consulted for surgical recommendations.   PAST MEDICAL HISTORY:  History of coronary artery disease, history of  congestive heart failure, status post coronary artery bypass grafting 1996  at [facility], history of myocardial infarction 2004 and 2005,  history of multiple stent placements, history of peripheral vascular  disease, history of abdominal aortic aneurysm 4.2 cm followed regularly,  status post right carotid endarterectomy 1988, status post  tonsillectomy,  history of hypertension, history of COPD.   MEDICATIONS:  Lasix, Zetia, Lipitor, enalapril, iron, clonidine, isosorbide,  albuterol, aspirin.   ALLERGIES:  SULFA, CODEINE.   SOCIAL HISTORY:  The patient lives in [place]. She is originally from  [place], [place]. She does smoke. She denies alcohol or drug use.   FAMILY HISTORY:  Noncontributory.   REVIEW OF SYSTEMS:  A 15-system review without significant other findings  except as noted above.   PHYSICAL EXAMINATION:  GENERAL:  A 77 year old [place] female in no acute  distress on ward [address], [facility]. VITAL SIGNS:  Temperature 98.3,  pulse 112, respirations 24, blood pressure 159/97, O2 sat 92% room air.  HEENT:  Shows her to be normocephalic, atraumatic. Sclerae clear.  Conjunctiva clear. Dentition poor. Mucous membranes moist. Voice normal.  NECK:  Palpation of the neck shows no mass, no tenderness, no thyroid  nodularity, no lymphadenopathy.  CHEST:  Auscultation of the chest shows bilateral rhonchi without rales or  wheeze. There is a well-healed median sternotomy wound.  CARDIAC:  Shows regular rate and rhythm without significant murmur.  Peripheral pulses are full. ABDOMEN:  Soft with mild distension. There are  bowel sounds  on auscultation. There is tenderness to palpation of the right  upper quadrant epigastrium. There is no mass. There is no voluntary  guarding. There is no sign of hernia. There is no hepatosplenomegaly.  EXTREMITIES:  Nontender without edema.  NEUROLOGICAL:  The patient is alert and oriented without focal deficit.   LABORATORY DATA:  Laboratory studies on the morning of July28 show Geissinger  count 11.6, hemoglobin 13, platelet count 341,000. Potassium is low at 3.3,  creatinine normal 1. Liver function tests show elevated SGOT 253, elevated  SGPT 173, elevated alkaline phosphatase 133, elevated total bilirubin 1.5.  Amylase is 2232 which is improved since July27. Likewise  lipase was elevated  at 1356, but also improved from the prior day's study.   RADIOGRAPHY:  CT scan of abdomen and pelvis performed July27 shows findings  consistent with acute cholecystitis and cholelithiasis. A 4.2 cm abdominal  aortic aneurysm is noted.   IMPRESSION:  1.  Acute cholecystitis.  2.  Cholelithiasis.  3.  Biliary pancreatitis.  4.  Coronary artery disease.  5.  Peripheral vascular disease.  6.  Hypertension.  7.  Chronic obstructive pulmonary disease.   PLAN:  1.  Agree with bowel rest, n.p.o. status with ice chips only.  2.  Correct electrolyte abnormalities.  3.  Agree with empiric antibiotic therapy with intravenous Zosyn.  4.  Repeat laboratory studies in a.m. [id number].  5.  Cardiology consultation for preoperative assessment and clearance by Dr.      Samule Ohm at [facility].  6.  Dental consult from Dr. Kristin Bruins for preoperative assessment.  7.  Follow clinically. If the patient fails to improve or laboratory studies      fail to normalize, she will require gastroenterology consultation for      possible preoperative ERCP.  8.  The patient will require cholecystectomy during this admission. We will      follow closely until that time.      Velora Heckler, MD  Electronically Signed     TMG/MEDQ  D:  03/27/2006  T:  03/27/2006  Job:  [id number]   cc:   Dario Guardian, M.D.  Fax: [phone]

## 2011-01-16 NOTE — Discharge Summary (Signed)
NAMEINGE, WALDROUP                           ACCOUNT NO.:  [id number]   MEDICAL RECORD NO.:  [id number]                   PATIENT TYPE:  INP   LOCATION:  6525                                 FACILITY:  [facility]   PHYSICIAN:  [name], M.D. [facility]         DATE OF BIRTH:  04-24-1934   DATE OF ADMISSION:  10/19/2002  DATE OF DISCHARGE:  10/24/2002                           DISCHARGE SUMMARY - REFERRING   HISTORY:  [name] is a 77 year old [place] female who was admitted through  [facility] ER with a three-week history of progressive dyspnea progressing  into orthopnea.  She was initially evaluated at [facility] ER on February  10, and treated with bronchodilators and IV Lasix.  At that time, her BNP  was over 1300.  She was discharged home and asked to follow up with her  [facility] Tan[facility] Orsak.  She was scheduled for an outpatient echocardiogram on  the 26th.  Due to her worsening dyspnea, she presented to the ER.  She  denied any associated chest discomfort, fevers, chills, nausea, vomiting, or  diarrhea.  She does occasionally have some cough with [place] sputum and new  onset orthopnea.  She was treated in the ER with IV Lasix with improvement  of her symptoms.  She has a history of coronary artery disease and is status  post bypass surgery in 1996 with a LIMA to the LAD, saphenous vein graft to  the diagonal, saphenous vein graft to the OM, and a stent to the RCA in  December 2001.  Continued tobacco use.  Hypertension.  Hyperlipidemia.  Peripheral vascular disease.  TIA x4.  Right CEA in 1998.  It was also noted  that prior to admission, she has not been taking her Zestoretic for several  days.  She states that she ran out of this.   LABORATORY DATA:  Respiratory culture showed normal laryngeal flora.  Fasting lipids showed a total cholesterol of 188, triglycerides 98, HDL 49,  LDL 121.  CK MBs were negative for myocardial infarction.  Troponins were  slightly elevated at 0.56  and 0.59 and 11.17.  Admission sodium was low at  125, potassium 4.3, BUN 14, creatinine 1.0, glucose 96.  Subsequent  chemistries showed an improvement of her sodium prior to discharge on the  23rd.  Sodium was 136, BUN was 13, creatinine 1.08.  Magnesium on the 23rd  was 2.0.  Admission H&H was 11.9 and 34.6, normal indices, platelets 420,  WBCs 9.7.  Subsequent hematologies were essentially unremarkable.  Admission  PT was 13.9.   The EKG showed normal sinus rhythm, left axis deviation, LVH, delayed R-  waves, nonspecific ST-T wave changes, T-wave inversion in V1 through V6 and  1 and AVL.  Echocardiogram on the 19th, showed an EF of 40-50%, septal  hypokinesis, periapical hypokinesis, mild LVH.  Doppler parameters were  consistent with abnormal LV relaxation.  Aortic valve thickness was  moderately increased  with mildly reduced aortic valve leaflet excursion  consistent with mild aortic valve stenosis.  There is mild AI, transaortic  valve gradient was 13.5, valve area 1.42, mild MR, mild left atrial  enlargement.   HOSPITAL COURSE:  The patient was admitted to [facility] and seen  in consultation by pulmonology.  They felt that she had acute CHF  decompensation secondary to poorly controlled hypertension and noncompliance  with medications.  Also, a mild exacerbation of COPD.  It was also noted  that she was hypoxic with possible right upper lobe infiltrate and she was  placed on antibiotics.  Lower extremity Dopplers did not show any evidence  of DVT, SVT, or Baker's cyst bilaterally.  Cardiac catheterization was  performed on February 20, by Dr. Samule Ohm.  According to his progress note,  her EF was 50% with distal apical akinesis, apical aneurysm, severe  hypokinesis of the distal inferior wall.  No MS, AS, and 1+ MR.  She had 80%  left main, 100% proximal circumflex, 100% proximal LAD, 70% diagonal 1, 90%  distal circumflex, post saphenous vein graft into the circumflex   anastomosis.  She has 70% ostial RCA lesion with density.  Her RCA stent was  patent.  She had a 90% mid RCA lesion with 90% branch off the mid LAD.  The  LIMA to the LAD was patent.  Saphenous vein graft to the circumflex was  patent.  It is to be noted that she has mildly increased right heart  pressures without significant valvular disease.  He confirmed severe  stenosis of both RCA ostium in the mid RCA, as well as the OM downstream of  the bypass anastomosis and moderate bilateral renal artery stenosis.  He  felt that she should undergo intervention of her RCA lesion.  This was  postponed due to respiratory issues still being treated.  By February 21,  she was feeling much better and her breathing had improved.  It was felt  that she had responded to her nebulizers and antibiotics.  On February 22,  it was felt that she was stable for cardiac catheterization.  On February  23, Dr. Samule Ohm performed stenting of the proximal and mid RCA lesion without  difficulty.  He recommended Plavix for at least three months, aspirin  indefinitely.  Her sheaths were removed, was on bedrest.  She was ambulating  without difficulty.  Cardiac rehabilitation saw and assisted with education  and ambulation.  Pulmonary encouraged ongoing abstinence from tobacco use  and she stated that she quit approximately three weeks ago.  They suggested  if she needed any further support or evaluation to please contact them as an  outpatient.  On February 24, after review, Dr. Samule Ohm felt that she could be  discharged home.   DISCHARGE DIAGNOSES:  1. Congestive heart failure secondary to hypertension, diastolic     dysfunction, noncompliance with medications.  2. Chronic obstructive pulmonary disease exacerbation.  3. Progressive coronary artery disease, as previously described.  4. Status post stenting to the proximal and mid RCA as previously described.  5. Hypoxia. 6. History of hyperlipidemia.  7. History as  previously.   DISPOSITION:  She is discharged home.   DISCHARGE MEDICATIONS:  Her new medications include:  1. Altace 10 mg daily.  2. Zocor 20 mg daily.  3. Lasix 20 mg daily.  4. K-Dur 20 mEq daily.  5. Sublingual nitroglycerin as needed.   She is asked to continue:  1. Clonidine 0.1 mg  b.i.d.  2. Coated aspirin 325 daily.  3. Lopressor 50 b.i.d.  4. Plavix 75 daily for at least three months.   She is instructed not to take her Zestoretic.   DISCHARGE INSTRUCTIONS:  Her activity is restricted in regards to lifting,  driving, sexual activity, or heavy exertion for two days.  Maintain low-salt-  fat-cholesterol diet.  If she has any problems with her catheterization  site, she was asked to call immediately.  She was advised no tobacco  products or smoking, to limit her alcohol intake to approximately 2 ounces  per day.  She was advised to weigh daily and record these.   FOLLOW UP:  She will see Dr. Samule Ohm in the office on March 30, at 12:15 p.m.  Prior to that, she will have abdominal ultrasound on March 18, at 8 a.m. to  assess her abdominal aortic aneurysm.  She was instructed nothing to eat or  drink after midnight on the 17th, but she may take her medications with a  small sip of water.  She was encouraged to begin a blood pressure and weight  tracking diary.  She was asked to bring all medications and her diary to all  office appointments.  She will keep a followup appointment with [facility]  on March 3rd.  At that time, her prescriptions will probably have to be  rewritten so that she will be able to continue her medications.  Prior to  discharge, case management will see her and hopefully assist with obtaining  medications prior to her discharge.  When she follows up with Dr. Samule Ohm, consideration should be given to  checking a six-week fasting lipids and LFTs since Zocor was initiated.  She  should also have a BMP on March 3rd, when she is seen at [facility], to   follow up on the adjustments of her medications and the initiation of an new  ACE inhibitor.     Joellyn Rued, P.A. [facility]                    [name], M.D. [facility]    EW/MEDQ  D:  10/24/2002  T:  10/24/2002  Job:  [id number]   cc:   [facility], Dr. Angelena Sole

## 2011-01-16 NOTE — Consult Note (Signed)
[name]               ACCOUNT NO.:  [id number]   MEDICAL RECORD NO.:  [id number]          PATIENT TYPE:  INP   LOCATION:  1606                         FACILITY:  [facility]   PHYSICIAN:  Willa Rough, M.D.     DATE OF BIRTH:  07-11-1934   DATE OF CONSULTATION:  DATE OF DISCHARGE:                                   CONSULTATION   [name] is currently in the hospital with acute cholecystitis.  She is  improving and it is planned to proceed with a cholecystectomy if this can be  allowed from the cardiac viewpoint.  The patient has known cardiac disease.  She underwent CABG in 1996.  She received a LIMA to the LAD and a vein graft  to the diagonal.  In 2004, she had PCI to the right coronary artery with a  non-ST-elevation MI.  In June of 2006, the patient had some congestive heart  failure.  She was diuresed and improved.  She had a Cardiolite scan showing  no ischemia.  The patient has not had heart failure since that time and has  not had her original angina symptoms since that time, and she has been quite  stable.  She has gone about full activities, including cleaning her home  recently, with no significant symptoms.   PAST MEDICAL HISTORY:   ALLERGIES:  1.  SULFA.  2.  CODEINE.   MEDICATIONS AS AN OUTPATIENT:  1.  Nitroglycerin p.r.n.  2.  Furosemide 40 daily.  3.  Zetia 10.  4.  Lipitor 80.  5.  Enalapril 10 b.i.d.  6.  Iron.  7.  Clonidine 0.1 b.i.d.  8.  Isosorbide mononitrate 30 mg.  9.  Albuterol inhaler.  10. Metoprolol 50 b.i.d.   OTHER MEDICAL PROBLEMS:  See the complete list below.   SOCIAL HISTORY:  The patient lives in [place].  She does live alone, and  she does work on her own home.   FAMILY HISTORY:  Noncontributory at this time.   REVIEW OF SYSTEMS:  She is actually feeling well at this time.  Other than  the abdominal pain that brought her into the hospital, her review of systems  is negative.   PHYSICAL EXAMINATION:  VITAL SIGNS:   Blood pressure today is 140/80.  Her  pulse is 80, and her beta blocker is to be resumed.  GENERAL:  The patient is oriented to person, time, and place.  Affect is  normal.  Several family members are in the room.  HEENT:  No xanthelasma.  She has normal extraocular motion.  NECK:  There are no carotid bruits.  There is no jugulovenous distention.  CARDIAC:  An S1 with an S2.  There is a soft systolic murmur.  ABDOMEN:  Not fully examined as she has the GI problem, which is being  watched.  EXTREMITIES:  She has no significant peripheral edema.   EKG reveals a sinus rhythm.  She has evidence of old inferior and anterior  infarct.  Hemoglobin this admission is 13.  BUN is 11 with a creatinine of  1.0.  Her  liver enzymes and pancreatic enzymes are elevated.  A chest x-ray  revealed no acute cardiac abnormalities.   PROBLEMS:  1.  History of a vulvar cellulitis in the past with MRSA.  She is on      isolation for her MRSA.  2.  History of some nonsustained ventricular tachycardia in the past.  She      did not have symptoms with this.  The patient is to be monitored in the      ICU after her procedure.  3.  History of CABG in 1996.  4.  History of PCI to the right coronary artery in 2004.  5.  History of a Cardiolite scan in 2005 showing no ischemia.  6.  Elevated lipids.  7.  History of renal artery stenosis that has not been severe.  8.  History of hypertension.  9.  COPD.  10. History of abdominal aortic aneurysm in the range of 4.0 cm, which is to      be followed.  11. History of smoking over time.  12. History of TIAs in the past.  13. History of one episode of congestive heart failure several years ago.  14. Current admission with cholecystitis.   The patient's overall cardiac status is stable.  I have carefully considered  her overall status.  Her last study was done in 2005.  She has no  significant cardiac symptoms since then.  I feel that it is most prudent to  allow her  to have her gallbladder surgery.  She is completely asymptomatic.  However, her beta blocker must be restarted and I have ordered this today.  The beta blocker must be continued around the time of the surgery, including  the morning of the surgery, and if she cannot receive it p.o., IV beta  blocker should be given afterwards.  Fluids will need to be carefully  watched.  She is to be observed in the ICU after her surgery and then on  telemetry afterwards.           ______________________________  Willa Rough, M.D.     JK/MEDQ  D:  03/27/2006  T:  03/27/2006  Job:  [id number]   cc:   [name], M.D. [facility]  [address]  [address]  [place]  [place] [address]

## 2011-01-20 ENCOUNTER — Encounter: Payer: Self-pay | Admitting: Cardiology

## 2011-01-20 ENCOUNTER — Telehealth: Payer: Self-pay | Admitting: *Deleted

## 2011-01-20 DIAGNOSIS — I10 Essential (primary) hypertension: Secondary | ICD-10-CM

## 2011-01-20 NOTE — Telephone Encounter (Signed)
See phone note

## 2011-01-21 ENCOUNTER — Other Ambulatory Visit: Payer: Self-pay

## 2011-01-21 ENCOUNTER — Ambulatory Visit: Payer: Medicare Other | Admitting: Vascular Surgery

## 2011-01-21 ENCOUNTER — Other Ambulatory Visit: Payer: Medicare Other | Admitting: *Deleted

## 2011-01-21 ENCOUNTER — Telehealth: Payer: Self-pay | Admitting: *Deleted

## 2011-01-21 DIAGNOSIS — I5022 Chronic systolic (congestive) heart failure: Secondary | ICD-10-CM

## 2011-01-21 NOTE — Telephone Encounter (Signed)
See phone note

## 2011-01-22 ENCOUNTER — Other Ambulatory Visit (INDEPENDENT_AMBULATORY_CARE_PROVIDER_SITE_OTHER): Payer: Medicare Other | Admitting: *Deleted

## 2011-01-22 ENCOUNTER — Ambulatory Visit (INDEPENDENT_AMBULATORY_CARE_PROVIDER_SITE_OTHER): Payer: Medicare Other | Admitting: *Deleted

## 2011-01-22 DIAGNOSIS — G459 Transient cerebral ischemic attack, unspecified: Secondary | ICD-10-CM

## 2011-01-22 DIAGNOSIS — I4891 Unspecified atrial fibrillation: Secondary | ICD-10-CM

## 2011-01-22 DIAGNOSIS — I10 Essential (primary) hypertension: Secondary | ICD-10-CM

## 2011-01-22 LAB — BASIC METABOLIC PANEL
CO2: 33 mEq/L — ABNORMAL HIGH (ref 19–32)
Calcium: 9.5 mg/dL (ref 8.4–10.5)
GFR: 47.9 mL/min — ABNORMAL LOW (ref >60.00)
Glucose, Bld: 99 mg/dL (ref 70–99)
Potassium: 4 mEq/L (ref 3.5–5.1)
Sodium: 142 mEq/L (ref 135–145)

## 2011-01-22 LAB — POCT INR: INR: 2.6

## 2011-01-23 ENCOUNTER — Other Ambulatory Visit: Payer: Self-pay | Admitting: Pharmacist

## 2011-01-23 ENCOUNTER — Telehealth: Payer: Self-pay | Admitting: Cardiology

## 2011-01-23 MED ORDER — WARFARIN SODIUM 5 MG PO TABS: ORAL_TABLET | ORAL | Status: DC

## 2011-01-24 NOTE — Discharge Summary (Signed)
[name], [name]               ACCOUNT NO.:  [id number]  MEDICAL RECORD NO.:  [id number]           PATIENT TYPE:  I  LOCATION:  4743                         FACILITY:  [facility]  PHYSICIAN:  [name], M.D.       DATE OF BIRTH:  1934-04-02  DATE OF ADMISSION:  12/16/2010 DATE OF DISCHARGE:  12/23/2010                              DISCHARGE SUMMARY   PRIMARY CARE PHYSICIAN:  Dario Guardian, MD  PULMONOLOGIST:  Kalman Shan, MD  CARDIOLOGIST:  Rollene Rotunda, MD, [facility]  CONDITION ON DISCHARGE:  The patient is alert and oriented.  She no longer has chest pain.  She is breathing easily on 2 L of oxygen nasal cannula.  She is feeling well and ready for discharge, but weak.  DISCHARGE DIAGNOSES: 1. Community-acquired pneumonia. 2. Chronic obstructive pulmonary disease exacerbation. 3. Coronary artery disease with a non-ST elevation myocardial     infarction. 4. Acute on chronic systolic congestive heart failure. 5. Tobacco abuse. 6. Dyslipidemia.  HISTORY OF PRESENT ILLNESS:  Ms. [name] is a 77 year old female with COPD and history of coronary artery bypass graft who presented to the [facility] ED very short of breath with cough and yellow sputum. She had been on Biaxin for 1 week, but her symptoms progressively became worse.  Her CT scan on admission showed right lower lobe pneumonia. Consequently, she was admitted and started on broad-spectrum IV antibiotics. 1. Community-acquired pneumonia.  Again, the patient was on Biaxin for     1 week prior to admission.  She was admitted, started on Rocephin     and Zithromax.  Her Snider count reached a peak of 30.8.  Over the     course of her hospitalization, she was changed from IV antibiotics     to p.o. Avelox, now on December 23, 2010, her WBC count is down to     16.2 and she has received a full course of antibiotic treatment. 2. COPD exacerbation.  The patient was admitted, placed on steroids,     nebulizers, and  supplemental O2 via nasal cannula.  Over the next     few days with treatment, her shortness of breath persisted.     Consequently Pulmonology consult was called, and the patient was     seen on December 19, 2010, by Dr. Kalman Shan.  His     recommendations were as follows:  He recommended starting     spirometry, becoming more aggressive with her bronchodilator     therapy, and more aggressive incentive spirometry to prevent     pulmonary atelectasis.  He further recommended that the enalapril     be discontinued and that she be switched to an ARB secondary to her     ongoing cough and COPD.  Finally, he recommended that when     discharged she will need pulmonary rehab outpatient followup and an     alpha-1 antitrypsin check for genetic causes of COPD.  Prior to     discharge, she has been started on Spiriva and received instruction     in the hospital on administering  it to herself.  At discharge, she     will have completed her antibiotic therapy; however, she will be on a     prednisone taper.  On December 23, 2010, her oxygen saturation would     drop to 84% to 86% when ambulating without oxygen therapy.     Consequently, she will be discharged home on 2 L of oxygen nasal     cannula. 3. Non-ST elevation MI with history of coronary artery bypass graft in     1996.  On admission, the patient's EKG did not show any ischemic     changes; however, her enzymes were cycled and found to be elevated.     Cardiology was then consulted.  The patient was seen by Dr. Arvilla Meres on December 16, 2010.  Dr. Gala Romney adjusted the patient's     medications starting Plavix, discontinuing her Cardizem, and     initiating an ARB Cozaar.  While it was felt that a cardiac     catheterization was needed.  Cardiology allowed time for her     pulmonary function to improve.  Cardiac catheterization was done on     December 22, 2010, by Dr. Marca Ancona.  Impression:  The patient has     a patent, but  diffusely diseased saphenous vein graft to the obtuse     marginal.  The obtuse marginal itself has an 80% to 90% stenosis;     however, this is unchanged from prior catheterization in 2004.  The     vein graft to the diagonal is totally occluded.  There is slow flow     down the left internal mammary artery, but the distal left anterior     descending does not appear patent.  There is 50% in-stent     restenosis in the proximal right coronary artery stent.  The     remainder of the right coronary artery looks okay.  There is severe     native left system disease that is unchanged from the past.  I do     not see a culprit lesion for acute coronary syndrome.  I suspect     her elevation of troponin may have been due to chronic obstructive     pulmonary disease exacerbation with hypoxemia as well as congestive     heart failure and volume overload.  The plan from Cardiology was     for medical management.  The patient will follow up as an     outpatient with Dr. Rollene Rotunda. 4. Tobacco abuse.  Just before her admission to the hospital, the     patient stopped smoking.  She said she was simply unable to     breathe.  While she was in the hospital, she was maintained on a     nicotine patch and received smoking cessation counseling.  The     patient will be discharged with a prescription for 7 nicotine     patches.  She has resolved to quit smoking. 5. Acute on chronic congestive heart failure.  The patient was     maintained on 80 mg of Lasix p.o. in house.  She received a cardiac     echo on December 17, 2010, which demonstrated an LVEF of 35% to 40%     along with moderate mitral valve regurgitation and mild-to-moderate     aortic valve stenosis.  Movement of her left ventricle demonstrated  septal, apical, and inferior wall hypokinesis.  The cavity size was     moderately dilated.  Currently, the patient's congestive heart     failure is being maintained on 80 mg of Lasix  daily.  PROCEDURES:  Cardiac catheterization on December 22, 2010, by Dr. Marca Ancona.  Results are as dictated above.  CONSULTATION: 1. The patient was seen on December 16, 2010, by Dr. Arvilla Meres of     [facility]. 2. The patient was seen on December 19, 2010, by Dr. Kalman Shan of      Pulmonary.  IMAGING:  On December 16, 2010, the patient had a CT of her chest that showed right lower lobe and right middle lobe thickening, predominant bronchial wall thickening with focal air space consolidation, and air bronchogram formation most compatible with bronchitis or early pneumonia.  There was no evidence for acute pulmonary embolism.  There were changes of emphysema and extensive atherosclerotic disease with areas of hypodense plaque that could indicate plaque ulceration or instability.  ANCILLARY STUDIES:  The patient had an echocardiogram done on December 17, 2010.  Results are as dictated in the hospital course.  PERTINENT LABS:  On admission, the patient's Wollin count was actually within normal limits; however, over the course of 24-48 hours, her Moultry count was elevated to 30.8.  Today December 23, 2010, it has decreased to 16.2.  She did have an ABG on December 19, 2010, which showed a pH of 7.4, pO2 of 113, and pCO2 of 40.  Cardiac enzymes were elevated on December 17, 2010, it reached a peak of troponin 9.32, CK-MB 126.8, CK 475.  Fasting lipid panel showed cholesterol 171, triglycerides 48, HDL 56, LDL 105. TSH was found to be within normal limits at 0.351.  During her hospital stay, her BUN and creatinine rose to a peak of creatinine 1.60, BUN 41, this was on December 19, 2010.  Today, December 23, 2010, her BUN is 25, creatinine 1.15.  PHYSICAL EXAMINATION:  GENERAL:  Today December 20, 2010, the patient is alert and oriented in no apparent distress. VITAL SIGNS:  As follows:  Temperature 98.0, pulse 88, respirations 18, [name] pressure 107/68, O2 sat is 90% on 1 L of nasal  cannula.  She will be increased to 2 L. HEENT:  Head is atraumatic normocephalic.  Eyes are anicteric with pupils are equal, round, reactive to light.  Her nose shows no nasal discharge or exterior lesions.  Mouth has moist mucous membranes with good dentition. NECK:  Supple with midline trachea.  No JVD.  No lymphadenopathy. CHEST:  No accessory muscle use.  She has decreased breath sounds, but no wheezes or crackles to my exam. HEART:  Regular rate and rhythm with slight systolic murmur. ABDOMEN:  Soft, nontender, nondistended with good bowel sounds. EXTREMITIES:  No clubbing, cyanosis, or edema.  She has 5/5 strength in each extremity. SKIN:  No rashes, bruises, or lesions. NEURO:  Cranial nerves II through XII are grossly intact.  She has no facial asymmetries.  No obvious focal neuro deficits. PSYCHIATRIC:  The patient is very pleasant.  Her demeanor is cooperative and appropriate.  Her grooming is good.  DISCHARGE MEDICATIONS: 1. Aspirin 81 mg 1 tablet by mouth daily. 2. Plavix 75 mg 1 tablet by mouth daily with a meal. 3. Losartan 25 mg 1 tablet by mouth daily. 4. Nicotine patch 21 mg change the patch daily for 7 days. 5. Prednisone 10 mg pill, take 4 tablets a day  for 2 days, then take 3     tablets a day for 2 days, then take 2 tablets a day for 2 days,     then take 1 tab a day for 2 days, then stop. 6. Spiriva Aerolizer 18 mcg caplets with inhaler inhale once daily. 7. Furosemide 80 mg 1 tablet by mouth daily. 8. Bisoprolol 10 mg 1 tablet by mouth daily. 9. Crestor 40 mg 1 tablet by mouth daily. 10.Imdur 30 mg 1 tablet by mouth daily. 11.Nitroglycerin sublingual 0.4 mg, take p.r.n. chest pain. 12.Stop taking enalapril 10 mg 1 tablet by mouth twice daily.  DISCHARGE INSTRUCTIONS:  The patient has been advised that she will be very weak for several weeks and needs a lot of extra assistance at home. She will return to her own home with home health and physical  therapy. She is instructed to increase her activity slowly and continue on a heart-healthy diet.  She has committed to stop smoking and has received cessation counseling.  She has followup appointments with Dr. Rollene Rotunda of [facility] on Jan 07, 2011, at 11 a.m., Dr. Merri Brunette of [facility] on Jan 06, 2011, at 12:15 p.m., as well as Dr. Kalman Shan of [facility] on Jan 06, 2011, at 3 p.m.  Per Dr. Jane Canary notes, the patient will need PFTs in the outpatient setting as well as an alpha-1 antitrypsin check for genetic causes of COPD.  She has been started on incentive spirometry and will go home with that.     Stephani Police, PA   ______________________________ [name], M.D.    MLY/MEDQ  D:  12/23/2010  T:  12/24/2010  Job:  [id number]  cc:   Dario Guardian, M.D. Kalman Shan, MD Rollene Rotunda, MD, [facility]  Electronically Signed by Algis Downs PA on 01/14/2011 03:47:31 PM Electronically Signed by [name] M.D. on 01/24/2011 08:13:49 AM

## 2011-01-30 ENCOUNTER — Other Ambulatory Visit (INDEPENDENT_AMBULATORY_CARE_PROVIDER_SITE_OTHER): Payer: Medicare Other | Admitting: *Deleted

## 2011-01-30 DIAGNOSIS — I5022 Chronic systolic (congestive) heart failure: Secondary | ICD-10-CM

## 2011-01-30 LAB — BASIC METABOLIC PANEL
CO2: 31 mEq/L (ref 19–32)
Chloride: 100 mEq/L (ref 96–112)
Creatinine, Ser: 1.1 mg/dL (ref 0.4–1.2)
Potassium: 3.5 mEq/L (ref 3.5–5.1)

## 2011-02-03 ENCOUNTER — Ambulatory Visit (INDEPENDENT_AMBULATORY_CARE_PROVIDER_SITE_OTHER)
Admission: RE | Admit: 2011-02-03 | Discharge: 2011-02-03 | Disposition: A | Discharge status: Home / Self Care | Payer: Medicare Other | Source: Ambulatory Visit | Attending: Internal Medicine | Admitting: Internal Medicine

## 2011-02-03 ENCOUNTER — Ambulatory Visit (INDEPENDENT_AMBULATORY_CARE_PROVIDER_SITE_OTHER): Payer: Medicare Other | Admitting: Internal Medicine

## 2011-02-03 ENCOUNTER — Encounter: Payer: Self-pay | Admitting: Internal Medicine

## 2011-02-03 VITALS — BP 118/76 | HR 80 | Temp 98.0°F | Ht <= 58 in | Wt 108.0 lb

## 2011-02-03 DIAGNOSIS — Z720 Tobacco use: Secondary | ICD-10-CM

## 2011-02-03 DIAGNOSIS — J449 Chronic obstructive pulmonary disease, unspecified: Secondary | ICD-10-CM

## 2011-02-03 DIAGNOSIS — F17200 Nicotine dependence, unspecified, uncomplicated: Secondary | ICD-10-CM

## 2011-02-03 DIAGNOSIS — I10 Essential (primary) hypertension: Secondary | ICD-10-CM

## 2011-02-03 DIAGNOSIS — J189 Pneumonia, unspecified organism: Secondary | ICD-10-CM

## 2011-02-03 MED ORDER — VARENICLINE TARTRATE 0.5 MG PO TABS: 0.5000 mg | ORAL_TABLET | Freq: Two times a day (BID) | ORAL | Status: DC

## 2011-02-03 MED ORDER — LOSARTAN POTASSIUM 25 MG PO TABS: 25.0000 mg | ORAL_TABLET | Freq: Every day | ORAL | Status: AC

## 2011-02-03 NOTE — Progress Notes (Signed)
SATURATION QUALIFICATIONS:  Patient Saturations on Room Air at Rest = 93%  Patient Saturations on Room Air while Ambulating = 88%  Patient Saturations on 2 Liters of oxygen while Ambulating = 94%

## 2011-02-03 NOTE — Progress Notes (Signed)
Subjective:    Patient ID: [name], female    DOB: 15-Feb-1934, 77 y.o.   MRN: [id number]  HPI  HPI Admitted 12/16/2010 through 12/25/2010 for 1. Community-acquired pneumonia.  2. Chronic obstructive pulmonary disease exacerbation.  3. Coronary artery disease with a non-ST elevation myocardial  infarction.  4. Acute on chronic systolic congestive heart failure.  5. Tobacco abuse.   Now 01/06/2011: folllowing at pulmonary for COPD and tobacco abuse and pneumonia. She was discharged on spiriva and O2; lattter for first time. Feels well and back to baseline. Class 2 dyspnea; able to do ADLs without problem. No cough at baseline . Compliant with medications. Interested in pulmonary rehab. Interested in quitting smoking. Agreeable for chantix; no contraindications. Denies chest pain, fever. REC: SPiriva for COPD, REFER REHAB, SPIRO at next visit. START CHANTIX FOR SMOKING. CXR in 4 weeks  OV 02/03/2011: Followup COPD, tobacco abuse, RLL pneumonia CAP. Continues to do better. Class 2 dyspnea and mild cough only. Spriiva working well; costing $30 per month.  Yet to start rehab; lost contact number. Spirometry today shows Fev1 0.65L/42% - Gold stage 3 copd. Feels well. CXR not done yet today for pna fu. Still not quit smoking. Reports chantix costly but is spending $50 per month on cigarettes. No new issues.    Review of Systems  Constitutional: Negative for fever and unexpected weight change.  HENT: Negative for ear pain, nosebleeds, congestion, sore throat, rhinorrhea, sneezing, trouble swallowing, dental problem, postnasal drip and sinus pressure.   Eyes: Negative for redness and itching.  Respiratory: Positive for cough and shortness of breath. Negative for chest tightness and wheezing.   Cardiovascular: Negative for palpitations and leg swelling.  Gastrointestinal: Negative for nausea and vomiting.  Genitourinary: Negative for dysuria.  Musculoskeletal: Negative for joint swelling.  Skin:  Negative for rash.  Neurological: Negative for headaches.  Hematological: Does not bruise/bleed easily.  Psychiatric/Behavioral: Negative for dysphoric mood. The patient is not nervous/anxious.        Objective:   Physical Exam Physical Exam  Vitals reviewed.  Constitutional: She is oriented to person, place, and time. She appears well-developed and well-nourished. No distress.  Thin, short  HENT:  Head: Normocephalic and atraumatic.  Right Ear: External ear normal.  Left Ear: External ear normal.  Mouth/Throat: Oropharynx is clear and moist. No oropharyngeal exudate.  Eyes: Conjunctivae and EOM are normal. Pupils are equal, round, and reactive to light. Right eye exhibits no discharge. Left eye exhibits no discharge. No scleral icterus.  Neck: Normal range of motion. Neck supple. No JVD present. No tracheal deviation present. No thyromegaly present.  Cardiovascular: Normal rate, regular rhythm, normal heart sounds and intact distal pulses. Exam reveals no gallop and no friction rub.  No murmur heard.  Pulmonary/Chest: Effort normal. No respiratory distress. She has no wheezes. She has rales. She exhibits no tenderness.  Scar of old cabg +. Mild dry rales at right base. Overall air entry is diminshed. No wheeze  Abdominal: Soft. Bowel sounds are normal. She exhibits no distension and no mass. There is no tenderness. There is no rebound and no guarding.  Musculoskeletal: Normal range of motion. She exhibits no edema and no tenderness.  Lymphadenopathy:  She has no cervical adenopathy.  Neurological: She is alert and oriented to person, place, and time. She has normal reflexes. No cranial nerve deficit. She exhibits normal muscle tone. Coordination normal.  Skin: Skin is warm and dry. No rash noted. She is not diaphoretic. No erythema. No  pallor.  Psychiatric: She has a normal mood and affect. Her behavior is normal. Judgment and thought content normal.            Assessment &  Plan:

## 2011-02-03 NOTE — Assessment & Plan Note (Signed)
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 Will do chantix refill again  3 min counseling time

## 2011-02-03 NOTE — Assessment & Plan Note (Signed)
#  COPD  - you have severe copd, lung function is 45% of capacity  - continue spiriva  - nurse will walk you for oxygen levels today  - continue oxygen at night and with exertion   - my nurse will give you number for rehab department; star there as soon as you can  - nurse will send blood for alpha 1 genetic test for copd

## 2011-02-03 NOTE — Patient Instructions (Addendum)
#  COPD  - you have severe copd, lung function is 45% of capacity  - continue spiriva  - nurse will walk you for oxygen levels today  - continue oxygen at night and with exertion   - my nurse will give you number for rehab department; star there as soon as you can  - nurse will send blood for alpha 1 genetic test for copd #Pneumonia  - have cxr today to see if it is cleared (PA and lateral view)  - wil call with results #Smoking  - cost of cigarettes is cost of chantix each month  - will do prescription again for it #BP  - will do refill on losartan today with 3 months supply  - after that get it from primary care #Followup  - 2 months to report progress

## 2011-02-03 NOTE — Assessment & Plan Note (Signed)
Refilled losaratan at her request Further refills from PMD - patient advised

## 2011-02-03 NOTE — Assessment & Plan Note (Signed)
RLL pna on CT April 2012 Will get cxr today to ensure clearance

## 2011-02-04 ENCOUNTER — Ambulatory Visit: Payer: Medicare Other | Admitting: Vascular Surgery

## 2011-02-04 ENCOUNTER — Other Ambulatory Visit: Payer: Self-pay

## 2011-02-04 ENCOUNTER — Encounter: Payer: Self-pay | Admitting: Cardiology

## 2011-02-05 ENCOUNTER — Ambulatory Visit (INDEPENDENT_AMBULATORY_CARE_PROVIDER_SITE_OTHER): Payer: Medicare Other | Admitting: *Deleted

## 2011-02-05 DIAGNOSIS — G459 Transient cerebral ischemic attack, unspecified: Secondary | ICD-10-CM

## 2011-02-05 DIAGNOSIS — Z7901 Long term (current) use of anticoagulants: Secondary | ICD-10-CM | POA: Insufficient documentation

## 2011-02-05 DIAGNOSIS — I4891 Unspecified atrial fibrillation: Secondary | ICD-10-CM

## 2011-02-15 ENCOUNTER — Encounter: Payer: Self-pay | Admitting: Internal Medicine

## 2011-02-16 ENCOUNTER — Telehealth: Payer: Self-pay | Admitting: Internal Medicine

## 2011-02-17 NOTE — Telephone Encounter (Signed)
Pt advised of CXR results. Carron Curie, CMA

## 2011-02-27 ENCOUNTER — Other Ambulatory Visit: Payer: Self-pay | Admitting: Cardiology

## 2011-02-27 ENCOUNTER — Encounter: Payer: Self-pay | Admitting: Internal Medicine

## 2011-03-02 ENCOUNTER — Encounter: Payer: Medicare Other | Admitting: *Deleted

## 2011-03-02 ENCOUNTER — Ambulatory Visit: Payer: Medicare Other | Admitting: Cardiology

## 2011-03-06 ENCOUNTER — Ambulatory Visit (INDEPENDENT_AMBULATORY_CARE_PROVIDER_SITE_OTHER)
Admission: RE | Admit: 2011-03-06 | Discharge: 2011-03-06 | Disposition: A | Discharge status: Home / Self Care | Payer: Medicare Other | Source: Ambulatory Visit | Attending: Adult Health | Admitting: Adult Health

## 2011-03-06 ENCOUNTER — Ambulatory Visit (INDEPENDENT_AMBULATORY_CARE_PROVIDER_SITE_OTHER): Payer: Medicare Other | Admitting: Adult Health

## 2011-03-06 ENCOUNTER — Encounter: Payer: Self-pay | Admitting: Adult Health

## 2011-03-06 DIAGNOSIS — J449 Chronic obstructive pulmonary disease, unspecified: Secondary | ICD-10-CM

## 2011-03-06 DIAGNOSIS — J189 Pneumonia, unspecified organism: Secondary | ICD-10-CM

## 2011-03-06 MED ORDER — PREDNISONE 10 MG PO TABS: ORAL_TABLET | ORAL | Status: AC

## 2011-03-06 MED ORDER — DOXYCYCLINE HYCLATE 100 MG PO TABS: 100.0000 mg | ORAL_TABLET | Freq: Two times a day (BID) | ORAL | Status: AC

## 2011-03-06 NOTE — Patient Instructions (Addendum)
Doxycycline 100mg  Twice daily  For 7 days  Mucinex DM Twice daily  As needed  Cough/congestion  Prednisone taper  Fluids and rest  Please contact office for sooner follow up if symptoms do not improve or worsen or seek emergency care  follow up Dr. Marchelle Gearing in 6 weeks and As needed   MOST IMPORTANT IS TO QUIT SMOKING

## 2011-03-06 NOTE — Progress Notes (Signed)
Subjective:    Patient ID: [name], female    DOB: 1934-01-23, 77 y.o.   MRN: [id number]  HPI  HPI Admitted 12/16/2010 through 12/25/2010 for 1. Community-acquired pneumonia.  2. Chronic obstructive pulmonary disease exacerbation.  3. Coronary artery disease with a non-ST elevation myocardial  infarction.  4. Acute on chronic systolic congestive heart failure.  5. Tobacco abuse.   Now 01/06/2011: folllowing at pulmonary for COPD and tobacco abuse and pneumonia. She was discharged on spiriva and O2; lattter for first time. Feels well and back to baseline. Class 2 dyspnea; able to do ADLs without problem. No cough at baseline . Compliant with medications. Interested in pulmonary rehab. Interested in quitting smoking. Agreeable for chantix; no contraindications. Denies chest pain, fever. REC: SPiriva for COPD, REFER REHAB, SPIRO at next visit. START CHANTIX FOR SMOKING. CXR in 4 weeks  OV 02/03/2011: Followup COPD, tobacco abuse, RLL pneumonia CAP. Continues to do better. Class 2 dyspnea and mild cough only. Spriiva working well; costing $30 per month.  Yet to start rehab; lost contact number. Spirometry today shows Fev1 0.65L/42% - Gold stage 3 copd. Feels well. CXR not done yet today for pna fu. Still not quit smoking. Reports chantix costly but is spending $50 per month on cigarettes. No new issues.   Acute OV 03/06/2011  Pt presents for an acute office visit. Complains of increased SOB w/ exertion, chest congestion x1week . OTC not helping. She is concerned her PNA is coming back. Of note she is on an ACE inhiibtor and is still smoking. No hemoptysis or discolored mucus . No fever.  Cough is worse at night.    Review of Systems Constitutional:   No  weight loss, night sweats,  Fevers, chills, fatigue, or  lassitude.  HEENT:   No headaches,  Difficulty swallowing,  Tooth/dental problems, or  Sore throat,                No sneezing, itching, ear ache, nasal congestion, post nasal drip,    CV:  No chest pain,  Orthopnea, PND, swelling in lower extremities, anasarca, dizziness, palpitations, syncope.   GI  No heartburn, indigestion, abdominal pain, nausea, vomiting, diarrhea, change in bowel habits, loss of appetite, bloody stools.   Resp:   No coughing up of blood.    No chest wall deformity  Skin: no rash or lesions.  GU: no dysuria, change in color of urine, no urgency or frequency.  No flank pain, no hematuria   MS:  No joint pain or swelling.  No decreased range of motion.    Psych:  No change in mood or affect. No depression or anxiety.            Objective:   Physical Exam GEN: A/Ox3; pleasant , NAD, well nourished   HEENT:  [place]/AT,  EACs-clear, TMs-wnl, NOSE-clear, THROAT-clear, no lesions, no postnasal drip or exudate noted.   NECK:  Supple w/ fair ROM; no JVD; normal carotid impulses w/o bruits; no thyromegaly or nodules palpated; no lymphadenopathy.  RESP  Coarse  BS w/ exp wheezes , no accessory muscle use, no dullness to percussion  CARD:  RRR, no m/r/g  , no peripheral edema, pulses intact, no cyanosis or clubbing.  GI:   Soft & nt; nml bowel sounds; no organomegaly or masses detected.  Musco: Warm bil, no deformities or joint swelling noted.   Neuro: alert, no focal deficits noted.    Skin: Warm, no lesions or rashes  Assessment & Plan:

## 2011-03-06 NOTE — Assessment & Plan Note (Addendum)
Exacerbation  If continues to have flare may need to consider changing ACE to ARB .   Plan:  Doxycycline 100mg  Twice daily  For 7 days  Mucinex DM Twice daily  As needed  Cough/congestion  Prednisone taper  Fluids and rest  Please contact office for sooner follow up if symptoms do not improve or worsen or seek emergency care  follow up Dr. Marchelle Gearing in 6 weeks and As needed   MOST IMPORTANT IS TO QUIT SMOKING

## 2011-03-12 ENCOUNTER — Encounter: Payer: Self-pay | Admitting: Cardiology

## 2011-03-18 ENCOUNTER — Ambulatory Visit (INDEPENDENT_AMBULATORY_CARE_PROVIDER_SITE_OTHER): Payer: Medicare Other | Admitting: *Deleted

## 2011-03-18 ENCOUNTER — Ambulatory Visit (INDEPENDENT_AMBULATORY_CARE_PROVIDER_SITE_OTHER): Payer: Medicare Other | Admitting: Cardiology

## 2011-03-18 ENCOUNTER — Encounter: Payer: Self-pay | Admitting: Cardiology

## 2011-03-18 DIAGNOSIS — I2510 Atherosclerotic heart disease of native coronary artery without angina pectoris: Secondary | ICD-10-CM

## 2011-03-18 DIAGNOSIS — R05 Cough: Secondary | ICD-10-CM

## 2011-03-18 DIAGNOSIS — I10 Essential (primary) hypertension: Secondary | ICD-10-CM

## 2011-03-18 DIAGNOSIS — F17200 Nicotine dependence, unspecified, uncomplicated: Secondary | ICD-10-CM

## 2011-03-18 DIAGNOSIS — I359 Nonrheumatic aortic valve disorder, unspecified: Secondary | ICD-10-CM

## 2011-03-18 DIAGNOSIS — I714 Abdominal aortic aneurysm, without rupture, unspecified: Secondary | ICD-10-CM

## 2011-03-18 DIAGNOSIS — I5022 Chronic systolic (congestive) heart failure: Secondary | ICD-10-CM

## 2011-03-18 DIAGNOSIS — I4891 Unspecified atrial fibrillation: Secondary | ICD-10-CM

## 2011-03-18 DIAGNOSIS — R059 Cough, unspecified: Secondary | ICD-10-CM

## 2011-03-18 DIAGNOSIS — G459 Transient cerebral ischemic attack, unspecified: Secondary | ICD-10-CM

## 2011-03-18 NOTE — Assessment & Plan Note (Signed)
I reviewed her last echo from the hospital. She has moderate MR and AS. However, we are going to manage these

## 2011-03-18 NOTE — Progress Notes (Signed)
HPI Patient presents for followup after a hospitalization earlier this year that was extensively outlined in the most recent office note. Since she was last seen here she has had no new cardiovascular problems. The patient denies any new symptoms such as chest discomfort, neck or arm discomfort. There has been no new shortness of breath, PND or orthopnea. There have been no reported palpitations, presyncope or syncope.  Unfortunately she is still smoking.  Allergies  Allergen Reactions  . Codeine   . Sulfonamide Derivatives     Current Outpatient Prescriptions  Medication Sig Dispense Refill  . albuterol (PROVENTIL HFA) 108 (90 BASE) MCG/ACT inhaler Inhale 2 puffs into the lungs every 6 (six) hours as needed.  1 Inhaler  3  . aspirin 81 MG tablet Take 81 mg by mouth daily.        . bisoprolol (ZEBETA) 10 MG tablet Take 10 mg by mouth daily.        . cetirizine (ZYRTEC) 10 MG tablet Take 10 mg by mouth daily.        . cloNIDine (CATAPRES) 0.1 MG tablet Take 0.1 mg by mouth 2 (two) times daily.        . enalapril (VASOTEC) 10 MG tablet Take 10 mg by mouth 2 (two) times daily.        Marland Kitchen ezetimibe (ZETIA) 10 MG tablet Take 10 mg by mouth daily.        . ferrous fumarate (FERRETTS) 325 (106 FE) MG TABS Take 325 mg by mouth 3 (three) times daily.        . furosemide (LASIX) 80 MG tablet Take 80 mg by mouth daily.       . isosorbide mononitrate (IMDUR) 30 MG 24 hr tablet Take 30 mg by mouth daily.        Marland Kitchen losartan (COZAAR) 25 MG tablet Take 1 tablet (25 mg total) by mouth daily.  30 tablet  3  . multivitamin (THERAGRAN) per tablet Take 1 tablet by mouth 2 (two) times daily.        . nitroGLYCERIN (NITROSTAT) 0.4 MG SL tablet Place 0.4 mg under the tongue every 5 (five) minutes as needed.        . rosuvastatin (CRESTOR) 40 MG tablet Take 40 mg by mouth daily.        Marland Kitchen tiotropium (SPIRIVA) 18 MCG inhalation capsule Place 18 mcg into inhaler and inhale daily.        Marland Kitchen warfarin (COUMADIN) 5 MG tablet  TAKE AS DIRECTED PER ANTICOAGULATION CLINIC  30 tablet  0  . varenicline (CHANTIX) 0.5 MG tablet Take 1 tablet (0.5 mg total) by mouth 2 (two) times daily. First one week take only one tablet daily. Take tablets after meals and drink lot of water  60 tablet  2    Past Medical History  Diagnosis Date  . Coronary artery disease     a.  s/p CABG 1996 in [place] [facility];  b. cath 12/22/10: oLM 90-95%, LAD and CFX occluded, OM 80-90% after graft, pRCA stent 50%, mRCA stent ok, S-OM ok with 40-50%, L-LAD with slow flow, S-Dx occluded;  no changes - med Tx continued  . Ischemic cardiomyopathy     a. EF approx 40% echo 2009;  b. echo 4/12: EF 35-40%, mild LVH, mod MR, mild ot mod AS with mean 19 mmHg, mild AI, LAE  . Non-sustained ventricular tachycardia   . Renal artery stenosis   . Carotid stenosis     carotid ultrasoun 0-39%  R stenosis and 40-59% L stenosis;  dopplers 4/11: 0-39% bilat  . AAA (abdominal aortic aneurysm)     u/s 10/11: 4.6x4.7 cm  . Common iliac aneurysm     Left  2.3 cm  . MRSA (methicillin resistant Staphylococcus aureus)   . Vulvar cellulitis   . TIA (transient ischemic attack)   . Hyperlipidemia   . Hypertension   . Tobacco user   . COPD (chronic obstructive pulmonary disease)   . Peripheral vascular disease     carotid ultrasound 0-39% right stenosis and 40-59% left stenosis    Past Surgical History  Procedure Date  . Coronary artery bypass graft 1996    [facility]  . Finger surgery     A1 Pulley release of Left index and L right  . Cholecystectomy     w/ intraoperative cholangiogram  . Carotid endarterectomy 1988    ROS:  Numbness and decreased motor strength right 4th and 5th fingers.  Otherwise as stated in the HPI and negative for all other systems.  PHYSICAL EXAM BP 102/76  Pulse 80  Resp 16  Ht 4\' 10"  (1.473 m)  Wt 109 lb (49.442 kg)  BMI 22.78 kg/m2 GENERAL:  Well appearing HEENT:  Pupils equal round and reactive, fundi not visualized, oral mucosa  unremarkable NECK:  No jugular venous distention, waveform within normal limits, carotid upstroke brisk and symmetric, soft right bruits, no thyromegaly LYMPHATICS:  No cervical, inguinal adenopathy LUNGS:  Decreased BS  bilaterally BACK:  No CVA tenderness CHEST:  Well healed sternotomy scar HEART:  PMI not displaced or sustained,S1 and S2 within normal limits, no S3, no S4, no clicks, no rubs, apical systolic murmur mid peaking ABD:  Flat, positive bowel sounds normal in frequency i pitch, no bruits, no rebound, no guarding, no midline pulsatile mass, no hepatomegaly, no splenomegaly EXT:  2 plus pulses throughout, no edema, no cyanosis no clubbing SKIN:  No rashes no nodules NEURO:  Cranial nerves II through XII grossly intact, motor grossly intact throughout PSYCH:  Cognitively intact, oriented to person place and time   ASSESSMENT AND PLAN

## 2011-03-18 NOTE — Assessment & Plan Note (Signed)
She says she wants to quit smoking but she doesn't want any prescriptions. She has been educated.

## 2011-03-18 NOTE — Assessment & Plan Note (Signed)
The blood pressure is at target. No change in medications is indicated. We will continue with therapeutic lifestyle changes (TLC).  

## 2011-03-18 NOTE — Assessment & Plan Note (Signed)
I will keep her off the ACE inhibitor.

## 2011-03-18 NOTE — Assessment & Plan Note (Signed)
She seems to be euvolemic and she will continue the other meds as listed.

## 2011-03-18 NOTE — Patient Instructions (Signed)
Please continue medications as listed.  Follow up in 6 months with Dr Hochrein.  You will receive a letter in the mail 2 months before you are due.  Please call us when you receive this letter to schedule your follow up appointment.  

## 2011-03-18 NOTE — Assessment & Plan Note (Signed)
She will continue with anticoagulation and she has no contraindications.

## 2011-03-18 NOTE — Assessment & Plan Note (Signed)
The patient has no new sypmtoms.  No further cardiovascular testing is indicated.  We will continue with aggressive risk reduction and meds as listed.  

## 2011-03-18 NOTE — Assessment & Plan Note (Signed)
She is up-to-date with followup of her carotid and aortic disease.  She needs another abdominal ultrasound in November.

## 2011-03-31 ENCOUNTER — Encounter: Payer: Medicare Other | Admitting: *Deleted

## 2011-04-03 ENCOUNTER — Other Ambulatory Visit: Payer: Self-pay | Admitting: Cardiology

## 2011-04-08 ENCOUNTER — Other Ambulatory Visit: Payer: Self-pay | Admitting: Cardiology

## 2011-04-10 ENCOUNTER — Ambulatory Visit (INDEPENDENT_AMBULATORY_CARE_PROVIDER_SITE_OTHER): Payer: Medicare Other | Admitting: Internal Medicine

## 2011-04-10 ENCOUNTER — Encounter: Payer: Self-pay | Admitting: Internal Medicine

## 2011-04-10 VITALS — BP 110/70 | HR 90 | Temp 97.7°F | Ht <= 58 in | Wt 111.4 lb

## 2011-04-10 DIAGNOSIS — J441 Chronic obstructive pulmonary disease with (acute) exacerbation: Secondary | ICD-10-CM | POA: Insufficient documentation

## 2011-04-10 DIAGNOSIS — F17200 Nicotine dependence, unspecified, uncomplicated: Secondary | ICD-10-CM

## 2011-04-10 DIAGNOSIS — J449 Chronic obstructive pulmonary disease, unspecified: Secondary | ICD-10-CM

## 2011-04-10 NOTE — Assessment & Plan Note (Signed)
#  Smoking - please quit next week on Friday  - continue chantix  5 min counseling on hazards

## 2011-04-10 NOTE — Progress Notes (Signed)
  Subjective:    Patient ID: [name], female    DOB: 11-Feb-1934, 76 y.o.   MRN: [id number]  HPI Problem list 1. Gold stage 3 copd MM genotype- June 2012 spiro fev1 0.65L/42%, desat to 88% walking on RA 2. AECOPD - Admitted March 2012, OPD Rx July 2012 3. RLL PNA CAP Aprl 2012 - resolved on cxr July 2012 4. Tobacco abuse ongoing - started chantix May 2012  OV 04/10/2011: Followup for above 3 of 4 active issues. Dyspnea is stable class 3. Brought on walking in yard. Last night got more dyspneic quicily when walking dog but only 1 episode like that. Cough is mild and stable. No worsening wheeze or sputum color or volume. Still smoking but cut to 1/2 pack per day. Also taking chantix. Advised her to quit. Alpha 1 result reviewed: MM genotype  PMX review: Saw NP July 2012: AECOPD Rx with doxy and pred as opd Social review: Moved into daughters' house Fam Hx: no change since last visit   Review of Systems  Constitutional: Negative for fever and unexpected weight change.  HENT: Negative for ear pain, nosebleeds, congestion, sore throat, rhinorrhea, sneezing, trouble swallowing, dental problem, postnasal drip and sinus pressure.   Eyes: Negative for redness and itching.  Respiratory: Negative for cough, chest tightness, shortness of breath and wheezing.   Cardiovascular: Negative for palpitations and leg swelling.  Gastrointestinal: Negative for nausea and vomiting.  Genitourinary: Negative for dysuria.  Musculoskeletal: Negative for joint swelling.  Skin: Negative for rash.  Neurological: Negative for headaches.  Hematological: Does not bruise/bleed easily.  Psychiatric/Behavioral: Negative for dysphoric mood. The patient is not nervous/anxious.        Objective:   Physical Exam  Vitals reviewed. Constitutional: She is oriented to person, place, and time. She appears well-developed and well-nourished. No distress.  HENT:  Head: Normocephalic and atraumatic.  Right Ear: External  ear normal.  Left Ear: External ear normal.  Mouth/Throat: Oropharynx is clear and moist. No oropharyngeal exudate.  Eyes: Conjunctivae and EOM are normal. Pupils are equal, round, and reactive to light. Right eye exhibits no discharge. Left eye exhibits no discharge. No scleral icterus.  Neck: Normal range of motion. Neck supple. No JVD present. No tracheal deviation present. No thyromegaly present.  Cardiovascular: Normal rate, regular rhythm, normal heart sounds and intact distal pulses.  Exam reveals no gallop and no friction rub.   No murmur heard. Pulmonary/Chest: Effort normal and breath sounds normal. No respiratory distress. She has no wheezes. She has no rales. She exhibits no tenderness.       Forced scattered wheeze +  Abdominal: Soft. Bowel sounds are normal. She exhibits no distension and no mass. There is no tenderness. There is no rebound and no guarding.  Musculoskeletal: Normal range of motion. She exhibits no edema and no tenderness.  Lymphadenopathy:    She has no cervical adenopathy.  Neurological: She is alert and oriented to person, place, and time. She has normal reflexes. No cranial nerve deficit. She exhibits normal muscle tone. Coordination normal.  Skin: Skin is warm and dry. No rash noted. She is not diaphoretic. No erythema. No pallor.  Psychiatric: She has a normal mood and affect. Her behavior is normal. Judgment and thought content normal.          Assessment & Plan:

## 2011-04-10 NOTE — Assessment & Plan Note (Signed)
#  COPD  - you have severe copd and this is why you are short of breath  - continue spiriva -  Start symbicort 80/4.5 2 puff twice daily; take sample, discount card and learn technique - [place] program [place]  - have flu shot in fall - next visit, recheck breathing test and walking oxygen levels #Followup  - reutrn in  4-8 weeks to discuss quitting smoking and checking lungs out - next visit, recheck breathing test spirometry  and walking oxygen levels - come sooner if problems

## 2011-04-10 NOTE — Assessment & Plan Note (Signed)
Has had 2 AECOPD in 2012. Will add symbicort. She is off ACE inhibitor. If she has more for 2012, will have to consider roflumilast

## 2011-04-10 NOTE — Patient Instructions (Signed)
#  COPD  - you have severe copd and this is why you are short of breath  - continue spiriva -  Start symbicort 80/4.5 2 puff twice daily; take sample, discount card and learn technique - start the exercise program in [place] soon  - have flu shot in fall - next visit, recheck breathing test and walking oxygen levels #Smoking - please quit next week on Friday  - continue chantix #Followup  - reutrn in  4-8 weeks to discuss quitting smoking and checking lungs out - next visit, recheck breathing test spirometry  and walking oxygen levels - come sooner if problems

## 2011-04-13 ENCOUNTER — Telehealth: Payer: Self-pay | Admitting: Cardiology

## 2011-04-13 NOTE — Telephone Encounter (Signed)
Pt needs warfarin to be called in to [facility] # [phone]

## 2011-04-14 ENCOUNTER — Ambulatory Visit (INDEPENDENT_AMBULATORY_CARE_PROVIDER_SITE_OTHER): Payer: Medicare Other | Admitting: *Deleted

## 2011-04-14 DIAGNOSIS — G459 Transient cerebral ischemic attack, unspecified: Secondary | ICD-10-CM

## 2011-04-14 DIAGNOSIS — I4891 Unspecified atrial fibrillation: Secondary | ICD-10-CM

## 2011-04-14 LAB — POCT INR: INR: 1.3

## 2011-04-16 ENCOUNTER — Telehealth: Payer: Self-pay | Admitting: Internal Medicine

## 2011-04-16 NOTE — Telephone Encounter (Signed)
Spoke with pt daughter and advised the Spiriva and symbicort are maintenance meds to be used every day. The albuterol is to be used in addition to these meds on an as needed basis only. She states understanding. Carron Curie, CMA

## 2011-04-23 ENCOUNTER — Telehealth: Payer: Self-pay | Admitting: Internal Medicine

## 2011-04-23 MED ORDER — BUDESONIDE-FORMOTEROL FUMARATE 80-4.5 MCG/ACT IN AERO: 2.0000 | INHALATION_SPRAY | Freq: Two times a day (BID) | RESPIRATORY_TRACT | Status: DC

## 2011-04-23 MED ORDER — TIOTROPIUM BROMIDE MONOHYDRATE 18 MCG IN CAPS: 18.0000 ug | ORAL_CAPSULE | Freq: Every day | RESPIRATORY_TRACT | Status: DC

## 2011-04-23 NOTE — Telephone Encounter (Signed)
Per our documentation pt was to start Symbicort 80, and continue Spiriva, and should have refills left on Cozaar. Pt is aware refills sent to pharmacy.

## 2011-04-28 ENCOUNTER — Ambulatory Visit (INDEPENDENT_AMBULATORY_CARE_PROVIDER_SITE_OTHER): Payer: Medicare Other | Admitting: *Deleted

## 2011-04-28 DIAGNOSIS — G459 Transient cerebral ischemic attack, unspecified: Secondary | ICD-10-CM

## 2011-04-28 DIAGNOSIS — I4891 Unspecified atrial fibrillation: Secondary | ICD-10-CM

## 2011-04-28 LAB — POCT INR: INR: 1.8

## 2011-05-19 ENCOUNTER — Encounter: Payer: Medicare Other | Admitting: *Deleted

## 2011-05-22 ENCOUNTER — Ambulatory Visit (INDEPENDENT_AMBULATORY_CARE_PROVIDER_SITE_OTHER): Payer: Medicare Other | Admitting: *Deleted

## 2011-05-22 ENCOUNTER — Encounter: Payer: Self-pay | Admitting: Internal Medicine

## 2011-05-22 ENCOUNTER — Ambulatory Visit (INDEPENDENT_AMBULATORY_CARE_PROVIDER_SITE_OTHER): Payer: Medicare Other | Admitting: Internal Medicine

## 2011-05-22 DIAGNOSIS — F17200 Nicotine dependence, unspecified, uncomplicated: Secondary | ICD-10-CM

## 2011-05-22 DIAGNOSIS — I4891 Unspecified atrial fibrillation: Secondary | ICD-10-CM

## 2011-05-22 DIAGNOSIS — G459 Transient cerebral ischemic attack, unspecified: Secondary | ICD-10-CM

## 2011-05-22 DIAGNOSIS — Z23 Encounter for immunization: Secondary | ICD-10-CM

## 2011-05-22 DIAGNOSIS — J449 Chronic obstructive pulmonary disease, unspecified: Secondary | ICD-10-CM | POA: Insufficient documentation

## 2011-05-22 LAB — POCT INR: INR: 1.6

## 2011-05-22 MED ORDER — WARFARIN SODIUM 5 MG PO TABS: ORAL_TABLET | ORAL | Status: DC

## 2011-05-22 NOTE — Progress Notes (Signed)
Addended by: Darrell Jewel on: 05/22/2011 05:48 PM   Modules accepted: Orders

## 2011-05-22 NOTE — Assessment & Plan Note (Signed)
Severe gold stage 3 copd. No change in fev1  Plan conitnue inhalers pneumomvax today Already had flu shot conitnue rehab Use o2 with exertion as well along with nocutrnal use

## 2011-05-22 NOTE — Progress Notes (Signed)
  Subjective:    Patient ID: [name], female    DOB: Apr 05, 1934, 77 y.o.   MRN: [id number]  HPI Problem list 1. Gold stage 3 copd MM genotype-  --June 2012 spiro fev1 0.65L/42%, desat to 88% walking on RA - Sept 2012 spiro fev1 0.67L/43%  2. AECOPD - Admitted March 2012, OPD Rx July 2012 3. RLL PNA CAP Aprl 2012 - resolved on cxr July 2012 4. Tobacco abuse ongoing - started chantix May 2012  OV 05/22/2011: Followup for above 3 of 4 active issues. Dyspnea is stable class 3. Brought on walking in yard. Has cut down smoking to half pack a day; on chantix for 1 month. Not quit yete.   Cough is mild and stable. No worsening wheeze or sputum color or volume. Stable 2 pillow orthopnea + . Not using o2 with walking and did not realize she could  PMX review: no changes since august visit Social review: Living with daughters' [place] house. Wants [employer] forms filled out Fam Hx: no change since last visit     Review of Systems  Constitutional: Negative for fever and unexpected weight change.  HENT: Negative for ear pain, nosebleeds, congestion, sore throat, rhinorrhea, sneezing, trouble swallowing, dental problem, postnasal drip and sinus pressure.   Eyes: Negative for redness and itching.  Respiratory: Negative for cough, chest tightness, shortness of breath and wheezing.   Cardiovascular: Negative for palpitations and leg swelling.  Gastrointestinal: Negative for nausea and vomiting.  Genitourinary: Negative for dysuria.  Musculoskeletal: Negative for joint swelling.  Skin: Negative for rash.  Neurological: Negative for headaches.  Hematological: Does not bruise/bleed easily.  Psychiatric/Behavioral: Negative for dysphoric mood. The patient is not nervous/anxious.        Objective:   Physical Exam  Vitals reviewed. Constitutional: She is oriented to person, place, and time. She appears well-developed and well-nourished. No distress.  HENT:  Head: Normocephalic and  atraumatic.  Right Ear: External ear normal.  Left Ear: External ear normal.  Mouth/Throat: Oropharynx is clear and moist. No oropharyngeal exudate.  Eyes: Conjunctivae and EOM are normal. Pupils are equal, round, and reactive to light. Right eye exhibits no discharge. Left eye exhibits no discharge. No scleral icterus.  Neck: Normal range of motion. Neck supple. No JVD present. No tracheal deviation present. No thyromegaly present.  Cardiovascular: Normal rate, regular rhythm, normal heart sounds and intact distal pulses.  Exam reveals no gallop and no friction rub.   No murmur heard. Pulmonary/Chest: Effort normal and breath sounds normal. No respiratory distress. She has no wheezes. She has no rales. She exhibits no tenderness.        Abdominal: Soft. Bowel sounds are normal. She exhibits no distension and no mass. There is no tenderness. There is no rebound and no guarding.  Musculoskeletal: Normal range of motion. She exhibits no edema and no tenderness.  Lymphadenopathy:    She has no cervical adenopathy.  Neurological: She is alert and oriented to person, place, and time. She has normal reflexes. No cranial nerve deficit. She exhibits normal muscle tone. Coordination normal.  Skin: Skin is warm and dry. No rash noted. She is not diaphoretic. No erythema. No pallor.  Psychiatric: She has a normal mood and affect. Her behavior is normal. Judgment and thought content normal.          Assessment & Plan:

## 2011-05-22 NOTE — Patient Instructions (Signed)
Please quit smoking Continue smoking Buy nicoderm cq patch step down program over the counter Continue your inhalers Use your oxygen at sleep and walking Have pneumovax vaccine today REturn in 1 month to see my NP Tammy to discuss quitting smoking

## 2011-05-22 NOTE — Assessment & Plan Note (Signed)
5 moin counseling to quit Advised to continue chantix and add nicotine patch  Quit cold [place] ROV 1 month with NP to asess progress

## 2011-05-25 LAB — POCT CARDIAC MARKERS
CKMB, poc: 1.7
Myoglobin, poc: 64.1
Operator id: 196461
Troponin i, poc: <0.05

## 2011-05-25 LAB — DIFFERENTIAL
Eosinophils Absolute: 0.6
Eosinophils Relative: 6 — ABNORMAL HIGH
Lymphocytes Relative: 33
Lymphs Abs: 3.1
Monocytes Relative: 8
Neutrophils Relative %: 52

## 2011-05-25 LAB — CBC
HCT: 43.6
MCV: 87.9
RBC: 4.96
WBC: 9.6

## 2011-05-25 LAB — I-STAT 8, (EC8 V) (CONVERTED LAB)
Acid-Base Excess: 6 — ABNORMAL HIGH
BUN: 18
Bicarbonate: 33.4 — ABNORMAL HIGH
Chloride: 104
Glucose, Bld: 92
HCT: 46
Hemoglobin: 15.6 — ABNORMAL HIGH
Operator id: 196461
Potassium: 3.7
Sodium: 140
TCO2: 35
pCO2, Ven: 56.4 — ABNORMAL HIGH
pH, Ven: 7.38 — ABNORMAL HIGH

## 2011-05-25 LAB — POCT I-STAT CREATININE: Creatinine, Ser: 1.3 — ABNORMAL HIGH

## 2011-06-09 ENCOUNTER — Encounter: Payer: Self-pay | Admitting: Physician Assistant

## 2011-06-12 ENCOUNTER — Telehealth: Payer: Self-pay | Admitting: Cardiology

## 2011-06-12 ENCOUNTER — Ambulatory Visit (INDEPENDENT_AMBULATORY_CARE_PROVIDER_SITE_OTHER): Payer: Medicare Other | Admitting: *Deleted

## 2011-06-12 DIAGNOSIS — G459 Transient cerebral ischemic attack, unspecified: Secondary | ICD-10-CM

## 2011-06-12 DIAGNOSIS — I4891 Unspecified atrial fibrillation: Secondary | ICD-10-CM

## 2011-06-12 LAB — POCT INR: INR: 2.4

## 2011-06-12 NOTE — Telephone Encounter (Signed)
ROI faxed to [facility],Records received gave to [facility]   06/12/11/km

## 2011-06-22 ENCOUNTER — Ambulatory Visit: Payer: Medicare Other | Admitting: Adult Health

## 2011-06-26 ENCOUNTER — Encounter: Payer: Medicare Other | Admitting: *Deleted

## 2011-06-26 ENCOUNTER — Ambulatory Visit: Payer: Medicare Other | Admitting: Physician Assistant

## 2011-07-02 ENCOUNTER — Ambulatory Visit (INDEPENDENT_AMBULATORY_CARE_PROVIDER_SITE_OTHER): Payer: Medicare Other | Admitting: *Deleted

## 2011-07-02 ENCOUNTER — Encounter: Payer: Self-pay | Admitting: Physician Assistant

## 2011-07-02 ENCOUNTER — Ambulatory Visit (INDEPENDENT_AMBULATORY_CARE_PROVIDER_SITE_OTHER): Payer: Medicare Other | Admitting: Physician Assistant

## 2011-07-02 DIAGNOSIS — I25810 Atherosclerosis of coronary artery bypass graft(s) without angina pectoris: Secondary | ICD-10-CM

## 2011-07-02 DIAGNOSIS — I714 Abdominal aortic aneurysm, without rupture: Secondary | ICD-10-CM

## 2011-07-02 DIAGNOSIS — I2510 Atherosclerotic heart disease of native coronary artery without angina pectoris: Secondary | ICD-10-CM

## 2011-07-02 DIAGNOSIS — I5022 Chronic systolic (congestive) heart failure: Secondary | ICD-10-CM

## 2011-07-02 DIAGNOSIS — R9431 Abnormal electrocardiogram [ECG] [EKG]: Secondary | ICD-10-CM

## 2011-07-02 DIAGNOSIS — I2584 Coronary atherosclerosis due to calcified coronary lesion: Secondary | ICD-10-CM

## 2011-07-02 DIAGNOSIS — Z7901 Long term (current) use of anticoagulants: Secondary | ICD-10-CM

## 2011-07-02 DIAGNOSIS — I4891 Unspecified atrial fibrillation: Secondary | ICD-10-CM

## 2011-07-02 DIAGNOSIS — G459 Transient cerebral ischemic attack, unspecified: Secondary | ICD-10-CM

## 2011-07-02 DIAGNOSIS — F17200 Nicotine dependence, unspecified, uncomplicated: Secondary | ICD-10-CM

## 2011-07-02 LAB — POCT INR: INR: 2.4

## 2011-07-02 MED ORDER — DIGOXIN 125 MCG PO TABS: 125.0000 ug | ORAL_TABLET | Freq: Every day | ORAL | Status: DC

## 2011-07-02 NOTE — Assessment & Plan Note (Signed)
Volume stable.  I would like to see her HR better.  I suspect she is chronically hypoxic due to COPD and this is driving her HR up.  She is asymptomatic with it.  With her ischemic CM, I will put her on digoxin 0.125 mg QD.  Check an ECG and digoxin level in 2 weeks.  Follow up with Dr. Antoine Poche in January as previously scheduled.

## 2011-07-02 NOTE — Assessment & Plan Note (Signed)
Stable.  No angina.  Continue ASA and statin. 

## 2011-07-02 NOTE — Assessment & Plan Note (Signed)
We discussed the need to quit smoking.

## 2011-07-02 NOTE — Assessment & Plan Note (Signed)
Check BMET and Magnesium today.  No h/o syncope.

## 2011-07-02 NOTE — Progress Notes (Signed)
History of Present Illness: Primary Cardiologist:  Dr. Rollene Rotunda  [name] is a 77 y.o. female with a history of CAD, status post CABG in 1995, ischemic cardiomyopathy, COPD, AAA who was recently admitted to [facility] with COPD exacerbation.   She presents for post hospital follow up.   She developed a/c systolic CHF in 4/12 during an admission for COPD exacerbation and pneumonia.  She ruled in for a NSTEMI.  LHC 4/12: vein graft to the obtuse marginal was patent.  Her LIMA graft to the LAD demonstrated slow flow and her vein graft to the diagonal was occluded.  She had 80-90% stenosis in the obtuse marginal after touchdown.  Stents in the RCA were both patent.  Her anatomy was not felt to have significantly changed since prior catheterization and medical therapy was continued.  Prior to discharge, she developed atrial fibrillation and Plavix was discontinued and she was placed on Coumadin.  She was treated with antibiotics and steroids at [facility].  Pertinent labs: Hgb 11.7, K 4.3, creatinine 1.08, ALT 24, CE's neg x 3.  I reviewed her discharge records.  I do not see that she had any cardiac complications during her admission.  She denies chest pain, significant dyspnea, syncope, orthopnea, PND or edema.  Cough is improved.  She has finished her antibiotics.  No fevers.  She is still smoking.  No hemoptysis.     Past Medical History  Diagnosis Date  . Coronary artery disease     a.  s/p CABG 1996 in [place] [facility];  b. cath 12/22/10: oLM 90-95%, LAD and CFX occluded, OM 80-90% after graft, pRCA stent 50%, mRCA stent ok, S-OM ok with 40-50%, L-LAD with slow flow, S-Dx occluded;  no changes - med Tx continued  . Ischemic cardiomyopathy     a. EF approx 40% echo 2009;  b. echo 4/12: EF 35-40%, mild LVH, mod MR, mild ot mod AS with mean 19 mmHg, mild AI, LAE  . Non-sustained ventricular tachycardia   . Renal artery stenosis   . Carotid stenosis     carotid ultrasoun 0-39% R stenosis and 40-59% L  stenosis;  dopplers 4/11: 0-39% bilat  . AAA (abdominal aortic aneurysm)     u/s 10/11: 4.6x4.7 cm;  u/s 5/12 - 4.7x4.6 cm (repeat due 11/12)  . Common iliac aneurysm     Left  2.3 cm  . MRSA (methicillin resistant Staphylococcus aureus)   . Vulvar cellulitis   . TIA (transient ischemic attack)   . Hyperlipidemia   . Hypertension   . Tobacco user   . COPD (chronic obstructive pulmonary disease)   . Peripheral vascular disease     carotid ultrasound 0-39% right stenosis and 40-59% left stenosis  . COPD (chronic obstructive pulmonary disease) 01/06/2011  . Atrial fibrillation     coumadin started 4/12    Current Outpatient Prescriptions  Medication Sig Dispense Refill  . albuterol (PROVENTIL HFA) 108 (90 BASE) MCG/ACT inhaler Inhale 2 puffs into the lungs every 6 (six) hours as needed.  1 Inhaler  3  . aspirin 81 MG tablet Take 81 mg by mouth daily.        . bisoprolol (ZEBETA) 10 MG tablet Take 10 mg by mouth daily.        . budesonide-formoterol (SYMBICORT) 80-4.5 MCG/ACT inhaler Inhale 2 puffs into the lungs 2 (two) times daily.  1 Inhaler  3  . cetirizine (ZYRTEC) 10 MG tablet Take 10 mg by mouth daily.        Marland Kitchen  cloNIDine (CATAPRES) 0.1 MG tablet Take 0.1 mg by mouth 2 (two) times daily.        . CRESTOR 40 MG tablet TAKE 1 TABLET BY MOUTH EVERY DAY  30 tablet  6  . ezetimibe (ZETIA) 10 MG tablet Take 10 mg by mouth daily.        . ferrous fumarate (FERRETTS) 325 (106 FE) MG TABS Take 325 mg by mouth 3 (three) times daily.        . furosemide (LASIX) 80 MG tablet TAKE 1 TABLET BY MOUTH TWICE DAILY  60 tablet  6  . isosorbide mononitrate (IMDUR) 30 MG 24 hr tablet Take 30 mg by mouth daily.        Marland Kitchen losartan (COZAAR) 25 MG tablet Take 1 tablet (25 mg total) by mouth daily.  30 tablet  3  . multivitamin (THERAGRAN) per tablet Take 1 tablet by mouth 2 (two) times daily.        . nitroGLYCERIN (NITROSTAT) 0.4 MG SL tablet Place 0.4 mg under the tongue every 5 (five) minutes as needed.         . tiotropium (SPIRIVA) 18 MCG inhalation capsule Place 1 capsule (18 mcg total) into inhaler and inhale daily.  30 capsule  3  . warfarin (COUMADIN) 5 MG tablet Take as directed by the Anticoagulation Clinic.  30 tablet  3    Allergies  Allergen Reactions  . Codeine   . Sulfonamide Derivatives     History  Substance Use Topics  . Smoking status: Current Some Day Smoker -- 0.5 packs/day for 42 years    Types: Cigarettes  . Smokeless tobacco: Never Used   Comment: 8/20 - taking chantix, cut smoking down to 1/2 pack only  . Alcohol Use: No     ROS:  Please see the history of present illness.  All other systems reviewed and negative.   Vital Signs: BP 118/70  Pulse 88  Ht 4\' 10"  (1.473 m)  Wt 106 lb (48.081 kg)  BMI 22.15 kg/m2  PHYSICAL EXAM: Well nourished, well developed, in no acute distress HEENT: normal Neck: no JVD Cardiac:  normal S1, S2; RRR; 2/6 harsh crescendo decrescendo systolic murmur heard best at the RUSB Lungs:  Decreased breath sounds bilaterally, no wheezing, rhonchi or rales Abd: soft, nontender, no hepatomegaly Ext: no edema Skin: warm and dry Neuro:  CNs 2-12 intact, no focal abnormalities noted  EKG:  Sinus rhythm, heart rate 106, right axis, anteroseptal Q waves, PVCs and one couplet, prolonged QT, NSSTTW changes  ASSESSMENT AND PLAN:

## 2011-07-02 NOTE — Assessment & Plan Note (Signed)
Arrange follow up abdominal ultrasound.

## 2011-07-02 NOTE — Patient Instructions (Addendum)
Your physician wants you to follow-up in: 2-3 MONTHS TO SEE DR. HOCHREIN AROUND JAN OR FEB 2013. You will receive a reminder letter in the mail two months in advance. If you don't receive a letter, please call our office to schedule the follow-up appointment.   Your physician recommends that you return for lab work in: TODAY BMET, Corn 161.09 HEART FAILURE  Your physician recommends that you return for lab work in: 07/16/11 PLEASE SCHEDULE  FOR A NURSE VISIT FOR EKG; LAB VISIT FOR DIGOXIN LEVEL AND Your physician has requested that you have an abdominal aorta duplex DX FOLLOW UP ON AAA . During this test, an ultrasound is used to evaluate the aorta. Allow 30 minutes for this exam. Do not eat after midnight the day before and avoid carbonated beverages PT LIVES IN [facility]

## 2011-07-03 ENCOUNTER — Telehealth: Payer: Self-pay | Admitting: *Deleted

## 2011-07-03 DIAGNOSIS — I10 Essential (primary) hypertension: Secondary | ICD-10-CM

## 2011-07-03 LAB — MAGNESIUM: Magnesium: 2.4 mg/dL (ref 1.5–2.5)

## 2011-07-03 LAB — BASIC METABOLIC PANEL
CO2: 29 mEq/L (ref 19–32)
Chloride: 97 mEq/L (ref 96–112)
Creatinine, Ser: 1.4 mg/dL — ABNORMAL HIGH (ref 0.4–1.2)
Potassium: 3.1 mEq/L — ABNORMAL LOW (ref 3.5–5.1)

## 2011-07-03 MED ORDER — POTASSIUM CHLORIDE CRYS ER 20 MEQ PO TBCR: 40.0000 meq | EXTENDED_RELEASE_TABLET | Freq: Once | ORAL | Status: AC

## 2011-07-03 NOTE — Telephone Encounter (Signed)
Lab order and rx K+. [name]

## 2011-07-07 ENCOUNTER — Other Ambulatory Visit (INDEPENDENT_AMBULATORY_CARE_PROVIDER_SITE_OTHER): Payer: Medicare Other | Admitting: *Deleted

## 2011-07-07 DIAGNOSIS — I10 Essential (primary) hypertension: Secondary | ICD-10-CM

## 2011-07-07 LAB — BASIC METABOLIC PANEL
CO2: 31 mEq/L (ref 19–32)
Calcium: 9.3 mg/dL (ref 8.4–10.5)
Chloride: 104 mEq/L (ref 96–112)
Glucose, Bld: 93 mg/dL (ref 70–99)
Sodium: 143 mEq/L (ref 135–145)

## 2011-07-17 ENCOUNTER — Ambulatory Visit (INDEPENDENT_AMBULATORY_CARE_PROVIDER_SITE_OTHER): Payer: Medicare Other

## 2011-07-17 ENCOUNTER — Other Ambulatory Visit: Payer: Medicare Other | Admitting: *Deleted

## 2011-07-17 DIAGNOSIS — I25810 Atherosclerosis of coronary artery bypass graft(s) without angina pectoris: Secondary | ICD-10-CM

## 2011-07-17 DIAGNOSIS — I2584 Coronary atherosclerosis due to calcified coronary lesion: Secondary | ICD-10-CM

## 2011-07-17 DIAGNOSIS — I5022 Chronic systolic (congestive) heart failure: Secondary | ICD-10-CM

## 2011-07-17 NOTE — Progress Notes (Signed)
EKG obtained and pt sent to the lab for digoxin level.

## 2011-07-17 NOTE — Progress Notes (Signed)
ekg ok Tereso Newcomer, PA-C

## 2011-07-30 ENCOUNTER — Encounter: Payer: Medicare Other | Admitting: *Deleted

## 2011-07-30 ENCOUNTER — Encounter: Payer: Medicare Other | Admitting: Cardiology

## 2011-08-01 ENCOUNTER — Other Ambulatory Visit: Payer: Self-pay | Admitting: Cardiology

## 2011-08-12 ENCOUNTER — Encounter (INDEPENDENT_AMBULATORY_CARE_PROVIDER_SITE_OTHER): Payer: Medicare Other | Admitting: Cardiology

## 2011-08-12 ENCOUNTER — Ambulatory Visit (INDEPENDENT_AMBULATORY_CARE_PROVIDER_SITE_OTHER): Payer: Medicare Other | Admitting: *Deleted

## 2011-08-12 DIAGNOSIS — G459 Transient cerebral ischemic attack, unspecified: Secondary | ICD-10-CM

## 2011-08-12 DIAGNOSIS — I723 Aneurysm of iliac artery: Secondary | ICD-10-CM

## 2011-08-12 DIAGNOSIS — Z7901 Long term (current) use of anticoagulants: Secondary | ICD-10-CM

## 2011-08-12 DIAGNOSIS — I2584 Coronary atherosclerosis due to calcified coronary lesion: Secondary | ICD-10-CM

## 2011-08-12 DIAGNOSIS — I714 Abdominal aortic aneurysm, without rupture: Secondary | ICD-10-CM

## 2011-08-12 DIAGNOSIS — I5022 Chronic systolic (congestive) heart failure: Secondary | ICD-10-CM

## 2011-08-12 DIAGNOSIS — I25810 Atherosclerosis of coronary artery bypass graft(s) without angina pectoris: Secondary | ICD-10-CM

## 2011-08-12 DIAGNOSIS — I4891 Unspecified atrial fibrillation: Secondary | ICD-10-CM

## 2011-08-12 LAB — POCT INR: INR: 1.6

## 2011-08-26 ENCOUNTER — Ambulatory Visit (INDEPENDENT_AMBULATORY_CARE_PROVIDER_SITE_OTHER): Payer: Medicare Other | Admitting: *Deleted

## 2011-08-26 DIAGNOSIS — G459 Transient cerebral ischemic attack, unspecified: Secondary | ICD-10-CM

## 2011-08-26 DIAGNOSIS — I4891 Unspecified atrial fibrillation: Secondary | ICD-10-CM

## 2011-08-26 DIAGNOSIS — Z7901 Long term (current) use of anticoagulants: Secondary | ICD-10-CM

## 2011-08-26 LAB — POCT INR: INR: 1.5

## 2011-08-26 MED ORDER — WARFARIN SODIUM 5 MG PO TABS: ORAL_TABLET | ORAL | Status: DC

## 2011-09-07 ENCOUNTER — Ambulatory Visit: Payer: Medicare Other | Admitting: Cardiology

## 2011-09-09 ENCOUNTER — Ambulatory Visit (INDEPENDENT_AMBULATORY_CARE_PROVIDER_SITE_OTHER): Payer: Medicare Other | Admitting: *Deleted

## 2011-09-09 DIAGNOSIS — I4891 Unspecified atrial fibrillation: Secondary | ICD-10-CM

## 2011-09-09 DIAGNOSIS — G459 Transient cerebral ischemic attack, unspecified: Secondary | ICD-10-CM

## 2011-09-09 LAB — POCT INR: INR: 2.1

## 2011-10-01 ENCOUNTER — Telehealth: Payer: Self-pay | Admitting: Cardiology

## 2011-10-01 NOTE — Telephone Encounter (Signed)
New problem;  Per Amber, patient taken digoxin  0 .125 mg daily. C/O  Heart rate today is 46.

## 2011-10-01 NOTE — Telephone Encounter (Signed)
When dialed number left to call back the phone rings and then goes to a fast busy.

## 2011-10-01 NOTE — Telephone Encounter (Signed)
Was unable to reach [name] but I was able to speak with pt was states her HR now is 73.  Pt instructed to hold digoxin if HR is less than 60.  She states understanding and will follow up at her appt scheduled for 10/06/11

## 2011-10-06 ENCOUNTER — Ambulatory Visit: Payer: Medicare Other | Admitting: Cardiology

## 2011-10-06 ENCOUNTER — Encounter: Payer: Medicare Other | Admitting: *Deleted

## 2011-10-07 ENCOUNTER — Ambulatory Visit (INDEPENDENT_AMBULATORY_CARE_PROVIDER_SITE_OTHER): Payer: Medicare Other | Admitting: *Deleted

## 2011-10-07 DIAGNOSIS — I4891 Unspecified atrial fibrillation: Secondary | ICD-10-CM

## 2011-10-07 DIAGNOSIS — Z7901 Long term (current) use of anticoagulants: Secondary | ICD-10-CM

## 2011-10-07 DIAGNOSIS — G459 Transient cerebral ischemic attack, unspecified: Secondary | ICD-10-CM

## 2011-10-07 LAB — POCT INR: INR: 1.2

## 2011-10-09 ENCOUNTER — Telehealth: Payer: Self-pay | Admitting: Cardiology

## 2011-10-09 NOTE — Telephone Encounter (Signed)
Amber,[facility], calls b/c patient took her digoxin last pm and today HR is in the 40's-50's. She will instruct pt to check HR prior to taking Digoxin and not take it if HR is less than 60. Pt has follow-up appt on 10/16/11 with Dr. Rennis Chris RN

## 2011-10-09 NOTE — Telephone Encounter (Signed)
Pt's heart rate has been consistently in the 40's and 50's and she is on Digoxin and Amber would like some parameters for the drug

## 2011-10-16 ENCOUNTER — Ambulatory Visit (INDEPENDENT_AMBULATORY_CARE_PROVIDER_SITE_OTHER): Payer: Medicare Other | Admitting: Cardiology

## 2011-10-16 ENCOUNTER — Encounter: Payer: Self-pay | Admitting: Cardiology

## 2011-10-16 DIAGNOSIS — I6529 Occlusion and stenosis of unspecified carotid artery: Secondary | ICD-10-CM

## 2011-10-16 DIAGNOSIS — I714 Abdominal aortic aneurysm, without rupture: Secondary | ICD-10-CM

## 2011-10-16 DIAGNOSIS — I4891 Unspecified atrial fibrillation: Secondary | ICD-10-CM

## 2011-10-16 DIAGNOSIS — I2510 Atherosclerotic heart disease of native coronary artery without angina pectoris: Secondary | ICD-10-CM

## 2011-10-16 LAB — PROTIME-INR: INR: 1.01 (ref <1.50)

## 2011-10-16 NOTE — Assessment & Plan Note (Signed)
The patient has no new sypmtoms.  No further cardiovascular testing is indicated.  We will continue with aggressive risk reduction and meds as listed.  

## 2011-10-16 NOTE — Assessment & Plan Note (Signed)
She will have follow up of this in June

## 2011-10-16 NOTE — Assessment & Plan Note (Signed)
She seems to be euvolemic.  At this point, no change in therapy is indicated.  We have reviewed salt and fluid restrictions.  No further cardiovascular testing is indicated.   

## 2011-10-16 NOTE — Progress Notes (Signed)
HPI She presents for follow up of CAD.  Since I last saw her she has had no new cardiovascular problems. She denies any new chest pressure, neck or arm discomfort. She doesn't notice or palpitations and she has had no presyncope or syncope. She has had at least one episode of acute shortness of breath that responded to bronchodilators. She is trying to cut down her cigarette smoking with plans of quitting.  Allergies  Allergen Reactions  . Codeine   . Sulfonamide Derivatives     Current Outpatient Prescriptions  Medication Sig Dispense Refill  . albuterol (PROVENTIL HFA) 108 (90 BASE) MCG/ACT inhaler Inhale 2 puffs into the lungs every 6 (six) hours as needed.  1 Inhaler  3  . aspirin 81 MG tablet Take 81 mg by mouth daily.        . bisoprolol (ZEBETA) 10 MG tablet Take 10 mg by mouth daily.        . budesonide-formoterol (SYMBICORT) 80-4.5 MCG/ACT inhaler Inhale 2 puffs into the lungs 2 (two) times daily.  1 Inhaler  3  . cetirizine (ZYRTEC) 10 MG tablet Take 10 mg by mouth daily.        . cloNIDine (CATAPRES) 0.1 MG tablet Take 0.1 mg by mouth 2 (two) times daily.        . CRESTOR 40 MG tablet TAKE 1 TABLET BY MOUTH EVERY DAY  30 tablet  6  . ezetimibe (ZETIA) 10 MG tablet Take 10 mg by mouth daily.        . ferrous fumarate (FERRETTS) 325 (106 FE) MG TABS Take 325 mg by mouth 3 (three) times daily.        . furosemide (LASIX) 80 MG tablet TAKE 1 TABLET BY MOUTH TWICE DAILY  60 tablet  6  . isosorbide mononitrate (IMDUR) 30 MG 24 hr tablet TAKE 1 TABLET BY MOUTH EVERY DAY  30 tablet  9  . losartan (COZAAR) 25 MG tablet Take 1 tablet (25 mg total) by mouth daily.  30 tablet  3  . multivitamin (THERAGRAN) per tablet Take 1 tablet by mouth 2 (two) times daily.        . nitroGLYCERIN (NITROSTAT) 0.4 MG SL tablet Place 0.4 mg under the tongue every 5 (five) minutes as needed.        . potassium chloride SA (KLOR-CON M20) 20 MEQ tablet Take 2 tablets (40 mEq total) by mouth once.  5 tablet   1  . tiotropium (SPIRIVA) 18 MCG inhalation capsule Place 1 capsule (18 mcg total) into inhaler and inhale daily.  30 capsule  3  . warfarin (COUMADIN) 5 MG tablet Take as directed by the Anticoagulation Clinic.  40 tablet  3    Past Medical History  Diagnosis Date  . Coronary artery disease     a.  s/p CABG 1996 in [facility];  b. cath 12/22/10: oLM 90-95%, LAD and CFX occluded, OM 80-90% after graft, pRCA stent 50%, mRCA stent ok, S-OM ok with 40-50%, L-LAD with slow flow, S-Dx occluded;  no changes - med Tx continued  . Ischemic cardiomyopathy     a. EF approx 40% echo 2009;  b. echo 4/12: EF 35-40%, mild LVH, mod MR, mild ot mod AS with mean 19 mmHg, mild AI, LAE  . Non-sustained ventricular tachycardia   . Renal artery stenosis   . Carotid stenosis     carotid ultrasoun 0-39% R stenosis and 40-59% L stenosis;  dopplers 4/11: 0-39% bilat  . AAA (  abdominal aortic aneurysm)     u/s 10/11: 4.6x4.7 cm;  u/s 5/12 - 4.7x4.6 cm (repeat due 11/12)  . Common iliac aneurysm     Left  2.3 cm  . MRSA (methicillin resistant Staphylococcus aureus)   . Vulvar cellulitis   . TIA (transient ischemic attack)   . Hyperlipidemia   . Hypertension   . Tobacco user   . COPD (chronic obstructive pulmonary disease)   . Peripheral vascular disease     carotid ultrasound 0-39% right stenosis and 40-59% left stenosis  . COPD (chronic obstructive pulmonary disease) 01/06/2011  . Atrial fibrillation     coumadin started 4/12    Past Surgical History  Procedure Date  . Coronary artery bypass graft 1996    [facility]  . Finger surgery     A1 Pulley release of Left index and L right  . Cholecystectomy     w/ intraoperative cholangiogram  . Carotid endarterectomy 1988    ROS:  Numbness and decreased motor strength right 4th and 5th fingers.  Otherwise as stated in the HPI and negative for all other systems.  PHYSICAL EXAM BP 110/60  Pulse 89  Ht 4\' 11"  (1.499 m)  Wt 105 lb (47.628 kg)  BMI 21.21  kg/m2 GENERAL:  Well appearing HEENT:  Pupils equal round and reactive, fundi not visualized, oral mucosa unremarkable, dentures NECK:  No jugular venous distention, waveform within normal limits, carotid upstroke brisk and symmetric, soft right bruits and right CEA scar, no thyromegaly LYMPHATICS:  No cervical, inguinal adenopathy LUNGS:  Decreased BS  bilaterally BACK:  No CVA tenderness CHEST:  Well healed sternotomy scar HEART:  PMI not displaced or sustained,S1 and S2 within normal limits, no S3, no S4, no clicks, no rubs, apical systolic murmur mid peaking ABD:  Flat, positive bowel sounds normal in frequency and pitch, no bruits, no rebound, no guarding, no midline pulsatile mass, no hepatomegaly, no splenomegaly EXT:  2 plus pulses upper with diminished DP/PT on the left no edema, no cyanosis no clubbing SKIN:  No rashes no nodules NEURO:  Cranial nerves II through XII grossly intact, motor grossly intact throughout PSYCH:  Cognitively intact, oriented to person place and time  EKG:  Sinus rhythm, rate 89, premature ectopic complexes, poor anterior R wave progression, QTC prolonged, old anteroseptal infarct.  10/16/2011  ASSESSMENT AND PLAN

## 2011-10-16 NOTE — Patient Instructions (Signed)
Please have lab work today.  Continue all medications as listed.  Your physician has requested that you have a carotid duplex. This test is an ultrasound of the carotid arteries in your neck. It looks at blood flow through these arteries that supply the brain with blood. Allow one hour for this exam. There are no restrictions or special instructions.  Your physician has requested that you have an abdominal aorta duplex. During this test, an ultrasound is used to evaluate the aorta. Allow 30 minutes for this exam. Do not eat after midnight the day before and avoid carbonated beverages.  Follow up with Dr Antoine Poche in 6 months

## 2011-10-16 NOTE — Assessment & Plan Note (Signed)
This was 4.6 x 4.6 in December. She will have followup in June. She also had some mild iliac aneurysmal dilatation which will be followed.

## 2011-10-16 NOTE — Assessment & Plan Note (Signed)
She could not afford Chantix. She's trying to taper. We discussed establishing a quit date

## 2011-10-16 NOTE — Assessment & Plan Note (Signed)
She tolerates this rhythm with rate control and anticoagulation. In fact her rate has been slow at times. A home nurse suggested stopping her digoxin and I agree with this.

## 2011-10-19 ENCOUNTER — Encounter: Payer: Medicare Other | Admitting: *Deleted

## 2011-10-20 ENCOUNTER — Ambulatory Visit: Payer: Self-pay | Admitting: Cardiology

## 2011-10-20 DIAGNOSIS — I4891 Unspecified atrial fibrillation: Secondary | ICD-10-CM

## 2011-10-20 DIAGNOSIS — Z7901 Long term (current) use of anticoagulants: Secondary | ICD-10-CM

## 2011-10-30 ENCOUNTER — Ambulatory Visit (INDEPENDENT_AMBULATORY_CARE_PROVIDER_SITE_OTHER): Payer: Medicare Other | Admitting: *Deleted

## 2011-10-30 DIAGNOSIS — I4891 Unspecified atrial fibrillation: Secondary | ICD-10-CM

## 2011-10-30 DIAGNOSIS — G459 Transient cerebral ischemic attack, unspecified: Secondary | ICD-10-CM

## 2011-10-30 DIAGNOSIS — Z7901 Long term (current) use of anticoagulants: Secondary | ICD-10-CM

## 2011-11-13 ENCOUNTER — Ambulatory Visit (INDEPENDENT_AMBULATORY_CARE_PROVIDER_SITE_OTHER): Payer: Medicare Other | Admitting: *Deleted

## 2011-11-13 DIAGNOSIS — I4891 Unspecified atrial fibrillation: Secondary | ICD-10-CM

## 2011-11-13 DIAGNOSIS — G459 Transient cerebral ischemic attack, unspecified: Secondary | ICD-10-CM

## 2011-11-13 DIAGNOSIS — Z7901 Long term (current) use of anticoagulants: Secondary | ICD-10-CM

## 2011-11-19 IMAGING — CR DG CHEST 2V
2 series · 2 of 2 positions shown · non-contrast
Comparison: 02/03/2011

CLINICAL DATA: Follow up pneumonia

CHEST - 2 VIEW

[view not recorded (1 of 2)]
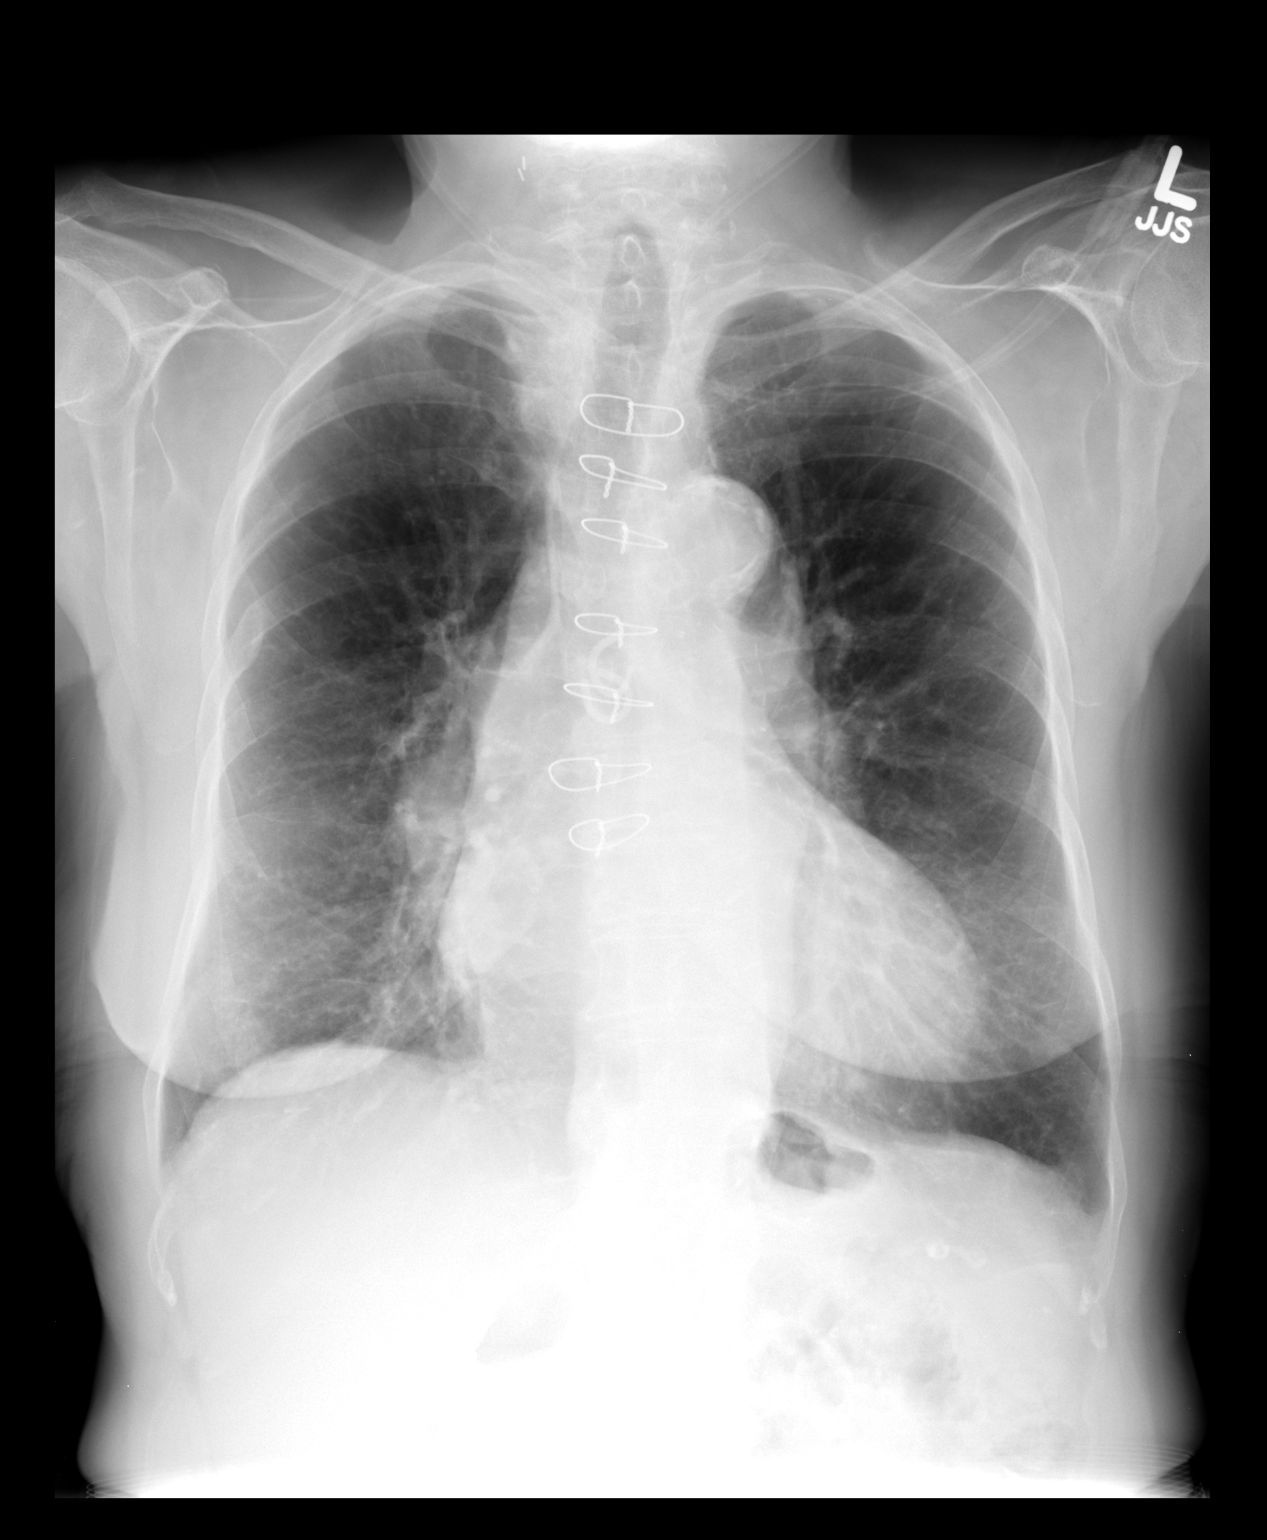

[view not recorded (2 of 2)]
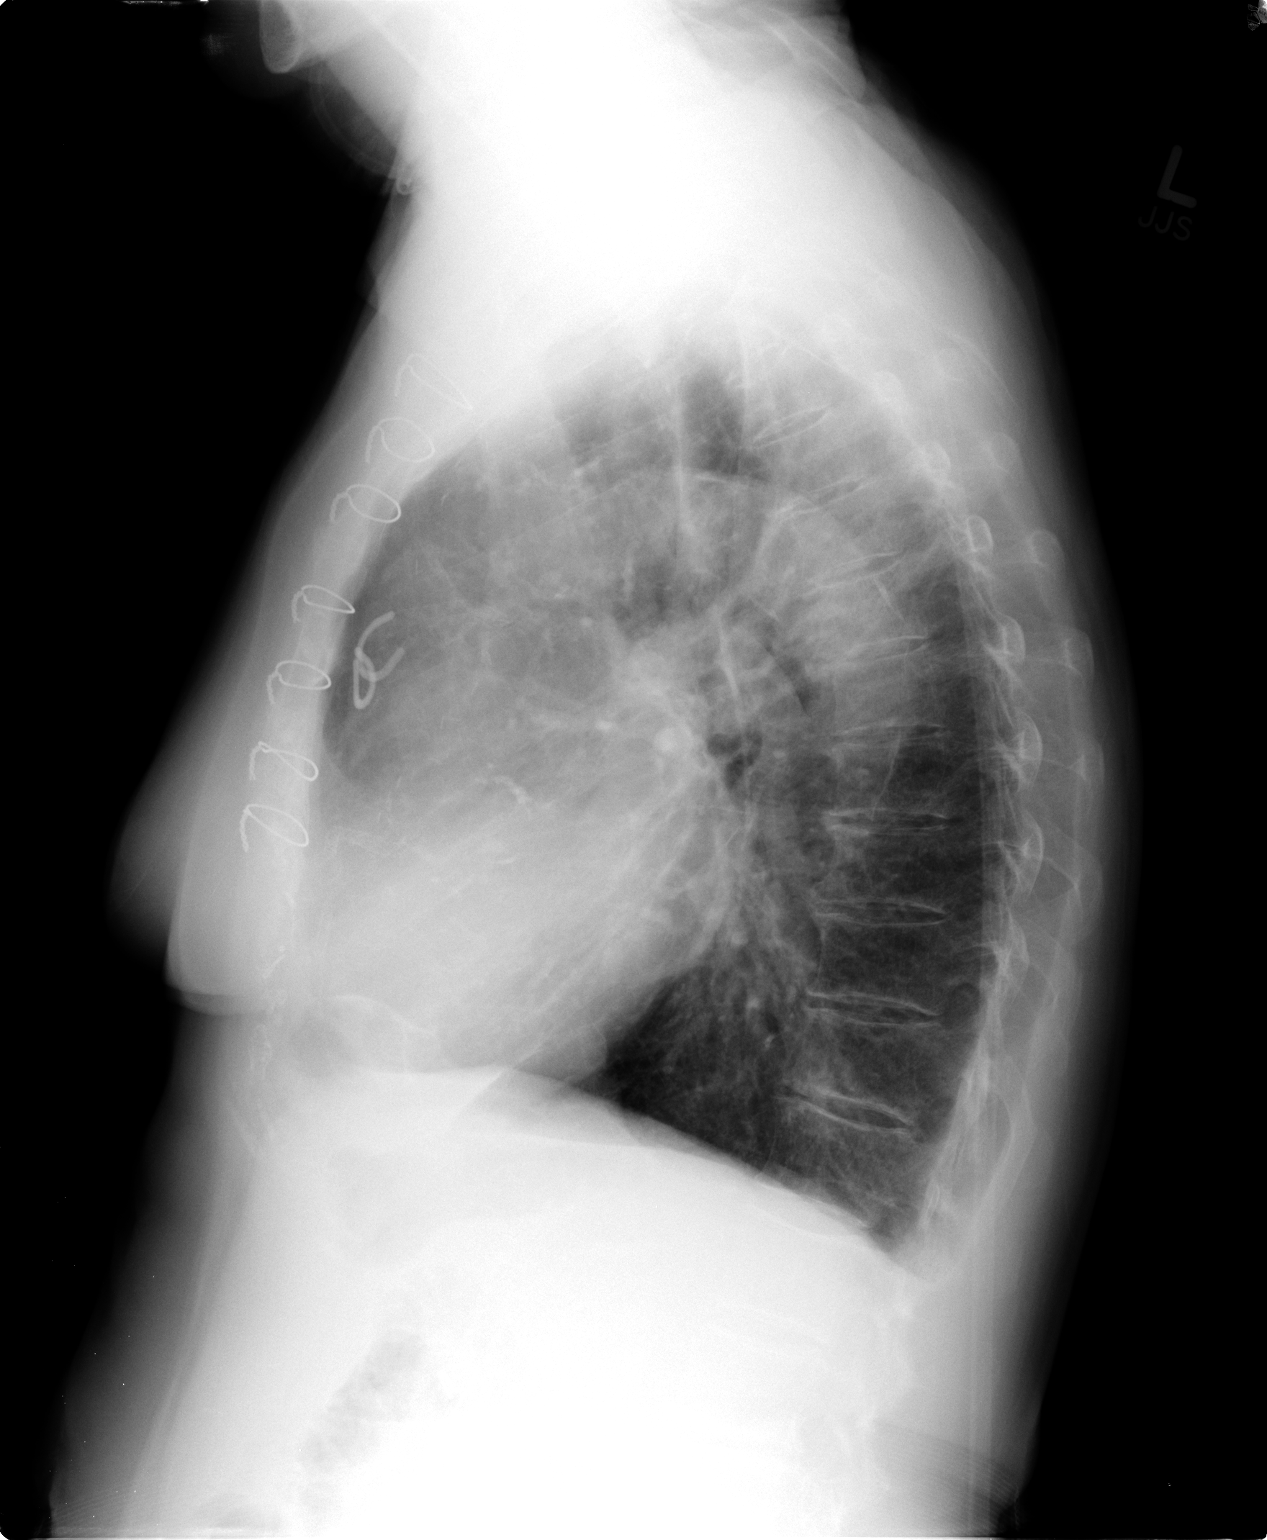

[2 of 2 positions shown; findings below may reference images not displayed]

FINDINGS: Lungs are essentially clear.  Right lung base opacity has
resolved. No pleural effusion or pneumothorax.

Stable cardiomegaly.  Median sternotomy.

Degenerative changes of the visualized thoracolumbar spine.  Old
right rib fracture.

Cholecystectomy clips.
IMPRESSION: No evidence of acute cardiopulmonary disease.

Stable cardiomegaly.

## 2011-12-04 ENCOUNTER — Ambulatory Visit (INDEPENDENT_AMBULATORY_CARE_PROVIDER_SITE_OTHER): Payer: Medicare Other | Admitting: *Deleted

## 2011-12-04 DIAGNOSIS — I4891 Unspecified atrial fibrillation: Secondary | ICD-10-CM

## 2011-12-04 DIAGNOSIS — G459 Transient cerebral ischemic attack, unspecified: Secondary | ICD-10-CM

## 2011-12-04 DIAGNOSIS — Z7901 Long term (current) use of anticoagulants: Secondary | ICD-10-CM

## 2011-12-04 LAB — POCT INR: INR: 5

## 2011-12-10 ENCOUNTER — Encounter: Payer: Self-pay | Admitting: Internal Medicine

## 2011-12-10 ENCOUNTER — Ambulatory Visit (INDEPENDENT_AMBULATORY_CARE_PROVIDER_SITE_OTHER): Payer: Medicare Other | Admitting: Internal Medicine

## 2011-12-10 VITALS — BP 104/62 | HR 119 | Ht <= 58 in | Wt 102.0 lb

## 2011-12-10 DIAGNOSIS — J441 Chronic obstructive pulmonary disease with (acute) exacerbation: Secondary | ICD-10-CM

## 2011-12-10 DIAGNOSIS — J449 Chronic obstructive pulmonary disease, unspecified: Secondary | ICD-10-CM

## 2011-12-10 MED ORDER — TIOTROPIUM BROMIDE MONOHYDRATE 18 MCG IN CAPS: 18.0000 ug | ORAL_CAPSULE | Freq: Every day | RESPIRATORY_TRACT | Status: AC

## 2011-12-10 MED ORDER — LEVALBUTEROL HCL 0.63 MG/3ML IN NEBU
0.6300 mg | INHALATION_SOLUTION | Freq: Once | RESPIRATORY_TRACT | Status: AC
  Administered 2011-12-10: 0.63 mg via RESPIRATORY_TRACT

## 2011-12-10 MED ORDER — METHYLPREDNISOLONE ACETATE 80 MG/ML IJ SUSP
80.0000 mg | Freq: Once | INTRAMUSCULAR | Status: AC
  Administered 2011-12-10: 80 mg via INTRAMUSCULAR

## 2011-12-10 MED ORDER — BUDESONIDE-FORMOTEROL FUMARATE 80-4.5 MCG/ACT IN AERO: 2.0000 | INHALATION_SPRAY | Freq: Two times a day (BID) | RESPIRATORY_TRACT | Status: AC

## 2011-12-10 MED ORDER — ALBUTEROL SULFATE HFA 108 (90 BASE) MCG/ACT IN AERS: 2.0000 | INHALATION_SPRAY | RESPIRATORY_TRACT | Status: AC | PRN

## 2011-12-10 NOTE — Patient Instructions (Signed)
Scripts sent to refill meds  Neb xop 0.63  Depo 80  Please make a follow-up appointment with Dr Marchelle Gearing

## 2011-12-10 NOTE — Progress Notes (Signed)
Subjective:    Patient ID: [name], female    DOB: [birth year], 78 y.o.   MRN: [id number]  HPI Problem list 1. Gold stage 3 copd MM genotype-  --June 2012 spiro fev1 0.65L/42%, desat to 88% walking on RA - Sept 2012 spiro fev1 0.67L/43%  2. AECOPD - Admitted March 2012, OPD Rx July 2012 3. RLL PNA CAP Aprl 2012 - resolved on cxr July 2012 4. Tobacco abuse ongoing - started chantix May 2012  OV 05/22/2011: Followup for above 3 of 4 active issues. Dyspnea is stable class 3. Brought on walking in yard. Has cut down smoking to half pack a day; on chantix for 1 month. Not quit yete.   Cough is mild and stable. No worsening wheeze or sputum color or volume. Stable 2 pillow orthopnea + . Not using o2 with walking and did not realize she could  12/10/11- Acute Visit-CD[facility]-   PCP Dr Merri Brunette MR patient-acute visit-ususally wears O2 QHS but has had to use during the day lately due to increased SOB;? fluid build-up. Increased shortness of breath "smothering" x2 weeks. No abrupt event. Has increased oxygen from sleep for 24 hours. Scant cough. Ran out of her rescue inhaler, Spiriva, Symbicort. Has home nebulizer with albuterol used 3 times per day from her primary physician.. Sitting up to sleep. Denies chest pain or palpitation.  PMX review: no changes since august visit Social review: Living with daughters' [facility] house. Wants [employer] forms filled out Fam Hx: no change since last visit  Review of Systems  Constitutional: Negative for fever and unexpected weight change.  HENT: Negative for ear pain, nosebleeds, congestion, sore throat, rhinorrhea, sneezing, trouble swallowing, dental problem, postnasal drip and sinus pressure.   Eyes: Negative for redness and itching.  Respiratory: + for cough, chest tightness, shortness of breath and wheezing.   Cardiovascular: Negative for palpitations and leg swelling.  Gastrointestinal: +Some nausea, no vomiting  Genitourinary: Negative for  dysuria.  Musculoskeletal: Negative for joint swelling.  Skin: Negative for rash.  Neurological: Negative for headaches.  Hematological: Does not bruise/bleed easily.  Psychiatric/Behavioral: Negative for dysphoric mood. The patient is not nervous/anxious.     Objective:   Physical Exam Vitals reviewed. Constitutional: She is oriented to person, place, and time. She appears well-developed and well-nourished. No distress.  O2 2L/94% HENT:  Head: Normocephalic and atraumatic.  Right Ear: External ear normal.  Left Ear: External ear normal.  Mouth/Throat: Oropharynx is clear and moist. No oropharyngeal exudate.  Eyes: Conjunctivae and EOM are normal. Pupils are equal, round, and reactive to light. Right eye exhibits no discharge. Left eye exhibits no discharge. No scleral icterus.  Neck: Normal range of motion. Neck supple. No JVD present. No tracheal deviation present. No thyromegaly present.  Cardiovascular: Normal rate, regular rhythm, normal heart sounds and intact distal pulses.  Exam reveals no gallop and no friction rub.   No murmur heard. Pulmonary/Chest: Effort normal and breath sounds normal. No respiratory distress. + slow expiratory wheezes. She has no rales. She exhibits no tenderness.        Abdominal: Soft. Bowel sounds are normal. She exhibits no distension and no mass. There is no tenderness. There is no rebound and no guarding.  Musculoskeletal: Normal range of motion.  +Edema 1-2+, no tenderness.  Lymphadenopathy:    She has no cervical adenopathy.  Neurological: She is alert and oriented to person, place, and time. She has normal reflexes. No cranial nerve deficit. She exhibits normal muscle  tone. Coordination normal.  Skin: Skin is warm and dry. No rash noted. She is not diaphoretic. No erythema. No pallor.  Psychiatric: She has a normal mood and affect. Her behavior is normal. Judgment and thought content normal.

## 2011-12-14 NOTE — Assessment & Plan Note (Addendum)
Acute exacerbation of COPD, strongly related to having run out of almost all of her medications except for her nebulizer. Plan-medication counseling. Refill rescue inhaler. Nebulizer treatment now Xopenex, and Depo-Medrol This should serve as a bridge until she can return to Dr. Jane Canary care soon.

## 2011-12-18 ENCOUNTER — Ambulatory Visit (INDEPENDENT_AMBULATORY_CARE_PROVIDER_SITE_OTHER): Payer: Medicare Other

## 2011-12-18 DIAGNOSIS — I4891 Unspecified atrial fibrillation: Secondary | ICD-10-CM

## 2011-12-18 DIAGNOSIS — G459 Transient cerebral ischemic attack, unspecified: Secondary | ICD-10-CM

## 2011-12-18 DIAGNOSIS — Z7901 Long term (current) use of anticoagulants: Secondary | ICD-10-CM

## 2011-12-18 LAB — POCT INR: INR: 2.5

## 2011-12-29 ENCOUNTER — Other Ambulatory Visit: Payer: Self-pay | Admitting: Cardiology

## 2011-12-30 NOTE — Telephone Encounter (Signed)
..   Requested Prescriptions   Pending Prescriptions Disp Refills  . furosemide (LASIX) 80 MG tablet [Pharmacy Med Name: FUROSEMIDE 80MG  TABLETS] 60 tablet 11    Sig: TAKE ONE TABLET BY MOUTH TWICE DAILY

## 2012-01-08 ENCOUNTER — Ambulatory Visit (INDEPENDENT_AMBULATORY_CARE_PROVIDER_SITE_OTHER): Payer: Medicare Other | Admitting: Pharmacist

## 2012-01-08 DIAGNOSIS — Z7901 Long term (current) use of anticoagulants: Secondary | ICD-10-CM

## 2012-01-08 DIAGNOSIS — G459 Transient cerebral ischemic attack, unspecified: Secondary | ICD-10-CM

## 2012-01-08 DIAGNOSIS — I4891 Unspecified atrial fibrillation: Secondary | ICD-10-CM

## 2012-01-08 LAB — POCT INR: INR: 1.4

## 2012-01-18 ENCOUNTER — Other Ambulatory Visit: Payer: Self-pay | Admitting: Cardiology

## 2012-01-22 ENCOUNTER — Ambulatory Visit (INDEPENDENT_AMBULATORY_CARE_PROVIDER_SITE_OTHER): Payer: Medicare Other | Admitting: *Deleted

## 2012-01-22 DIAGNOSIS — Z7901 Long term (current) use of anticoagulants: Secondary | ICD-10-CM

## 2012-01-22 DIAGNOSIS — I4891 Unspecified atrial fibrillation: Secondary | ICD-10-CM

## 2012-01-22 DIAGNOSIS — G459 Transient cerebral ischemic attack, unspecified: Secondary | ICD-10-CM

## 2012-01-22 LAB — POCT INR: INR: 4.3

## 2012-02-01 ENCOUNTER — Encounter (INDEPENDENT_AMBULATORY_CARE_PROVIDER_SITE_OTHER): Payer: Medicare Other

## 2012-02-01 ENCOUNTER — Ambulatory Visit (INDEPENDENT_AMBULATORY_CARE_PROVIDER_SITE_OTHER): Payer: Medicare Other | Admitting: *Deleted

## 2012-02-01 DIAGNOSIS — Z7901 Long term (current) use of anticoagulants: Secondary | ICD-10-CM

## 2012-02-01 DIAGNOSIS — I723 Aneurysm of iliac artery: Secondary | ICD-10-CM

## 2012-02-01 DIAGNOSIS — I4891 Unspecified atrial fibrillation: Secondary | ICD-10-CM

## 2012-02-01 DIAGNOSIS — G459 Transient cerebral ischemic attack, unspecified: Secondary | ICD-10-CM

## 2012-02-01 DIAGNOSIS — I714 Abdominal aortic aneurysm, without rupture: Secondary | ICD-10-CM

## 2012-02-17 ENCOUNTER — Telehealth: Payer: Self-pay | Admitting: Cardiology

## 2012-02-17 NOTE — Telephone Encounter (Signed)
Patient returning nurse call, she can be reached at [phone]

## 2012-02-17 NOTE — Telephone Encounter (Signed)
Left message that I will call again tomorrow.

## 2012-02-18 ENCOUNTER — Ambulatory Visit (INDEPENDENT_AMBULATORY_CARE_PROVIDER_SITE_OTHER): Payer: Medicare Other

## 2012-02-18 ENCOUNTER — Encounter (INDEPENDENT_AMBULATORY_CARE_PROVIDER_SITE_OTHER): Payer: Medicare Other

## 2012-02-18 DIAGNOSIS — G459 Transient cerebral ischemic attack, unspecified: Secondary | ICD-10-CM

## 2012-02-18 DIAGNOSIS — I4891 Unspecified atrial fibrillation: Secondary | ICD-10-CM

## 2012-02-18 DIAGNOSIS — I6529 Occlusion and stenosis of unspecified carotid artery: Secondary | ICD-10-CM

## 2012-02-18 DIAGNOSIS — Z7901 Long term (current) use of anticoagulants: Secondary | ICD-10-CM

## 2012-02-18 LAB — POCT INR: INR: 2.4

## 2012-02-18 NOTE — Telephone Encounter (Signed)
Pt aware of results of AAA and will repeat in 6 months

## 2012-02-25 ENCOUNTER — Telehealth: Payer: Self-pay | Admitting: *Deleted

## 2012-02-25 NOTE — Telephone Encounter (Signed)
lmtcb

## 2012-03-09 ENCOUNTER — Encounter: Payer: Self-pay | Admitting: *Deleted

## 2012-03-10 ENCOUNTER — Ambulatory Visit (INDEPENDENT_AMBULATORY_CARE_PROVIDER_SITE_OTHER): Payer: Medicare Other | Admitting: *Deleted

## 2012-03-10 DIAGNOSIS — I4891 Unspecified atrial fibrillation: Secondary | ICD-10-CM

## 2012-03-10 DIAGNOSIS — Z7901 Long term (current) use of anticoagulants: Secondary | ICD-10-CM

## 2012-03-10 DIAGNOSIS — G459 Transient cerebral ischemic attack, unspecified: Secondary | ICD-10-CM

## 2012-03-10 LAB — POCT INR: INR: 2.3

## 2012-04-11 ENCOUNTER — Ambulatory Visit: Payer: Self-pay | Admitting: Cardiovascular Disease

## 2012-04-11 DIAGNOSIS — I4891 Unspecified atrial fibrillation: Secondary | ICD-10-CM

## 2012-04-11 DIAGNOSIS — Z7901 Long term (current) use of anticoagulants: Secondary | ICD-10-CM

## 2012-04-19 ENCOUNTER — Ambulatory Visit: Payer: Self-pay | Admitting: Cardiology

## 2012-04-19 DIAGNOSIS — I4891 Unspecified atrial fibrillation: Secondary | ICD-10-CM

## 2012-04-19 DIAGNOSIS — Z7901 Long term (current) use of anticoagulants: Secondary | ICD-10-CM

## 2012-04-19 LAB — PROTIME-INR: INR: 1.3 — AB (ref <=1.1)

## 2012-04-27 ENCOUNTER — Ambulatory Visit: Payer: Self-pay | Admitting: Cardiovascular Disease

## 2012-04-27 DIAGNOSIS — Z7901 Long term (current) use of anticoagulants: Secondary | ICD-10-CM

## 2012-04-27 DIAGNOSIS — I4891 Unspecified atrial fibrillation: Secondary | ICD-10-CM

## 2012-05-03 ENCOUNTER — Ambulatory Visit (INDEPENDENT_AMBULATORY_CARE_PROVIDER_SITE_OTHER): Payer: Self-pay | Admitting: Cardiology

## 2012-05-03 DIAGNOSIS — R0989 Other specified symptoms and signs involving the circulatory and respiratory systems: Secondary | ICD-10-CM

## 2012-05-03 DIAGNOSIS — I4891 Unspecified atrial fibrillation: Secondary | ICD-10-CM

## 2012-05-03 DIAGNOSIS — Z7901 Long term (current) use of anticoagulants: Secondary | ICD-10-CM

## 2012-05-11 ENCOUNTER — Ambulatory Visit: Payer: Self-pay | Admitting: Cardiology

## 2012-05-11 DIAGNOSIS — I4891 Unspecified atrial fibrillation: Secondary | ICD-10-CM

## 2012-05-11 DIAGNOSIS — Z7901 Long term (current) use of anticoagulants: Secondary | ICD-10-CM

## 2012-05-11 LAB — PROTIME-INR: INR: 1.7 — AB (ref 0.9–1.1)

## 2012-05-30 ENCOUNTER — Ambulatory Visit: Payer: Self-pay | Admitting: Cardiology

## 2012-05-30 DIAGNOSIS — I4891 Unspecified atrial fibrillation: Secondary | ICD-10-CM

## 2012-05-30 DIAGNOSIS — Z7901 Long term (current) use of anticoagulants: Secondary | ICD-10-CM

## 2013-01-29 DEATH — deceased

## 2014-03-09 NOTE — Telephone Encounter (Signed)
Close encounter
